# Patient Record
Sex: Female | Born: 1990 | Race: Black or African American | Hispanic: No | Marital: Single | Smoking: Never smoker
Health system: Southern US, Community
[De-identification: ages and names within clinical notes are randomized; demographics above are authoritative.]

---
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
## 2011-06-13 LAB — POCT I-STAT, CHEM 8
Calcium, Ion: 1.17
Glucose, Bld: 93
HCT: 43
Hemoglobin: 14.6

## 2011-06-13 LAB — PREGNANCY, URINE: Preg Test, Ur: NEGATIVE

## 2011-06-13 LAB — CBC
Platelets: 237
RDW: 13.9

## 2011-06-13 LAB — DIFFERENTIAL
Basophils Absolute: 0
Basophils Relative: 0
Eosinophils Relative: 1
Lymphocytes Relative: 40
Neutro Abs: 2.8

## 2011-06-13 LAB — URINE MICROSCOPIC-ADD ON

## 2011-06-13 LAB — URINALYSIS, ROUTINE W REFLEX MICROSCOPIC
Nitrite: POSITIVE — AB
Specific Gravity, Urine: 1.019
Urobilinogen, UA: 0.2

## 2011-06-13 LAB — URINE CULTURE: Colony Count: >=100000

## 2014-05-19 ENCOUNTER — Emergency Department (HOSPITAL_COMMUNITY)
Admission: EM | Admit: 2014-05-19 | Discharge: 2014-05-19 | Disposition: A | Discharge status: Home / Self Care | Payer: Self-pay | Attending: Emergency Medicine | Admitting: Emergency Medicine

## 2014-05-19 ENCOUNTER — Encounter (HOSPITAL_COMMUNITY): Payer: Self-pay | Admitting: Emergency Medicine

## 2014-05-19 DIAGNOSIS — K044 Acute apical periodontitis of pulpal origin: Secondary | ICD-10-CM | POA: Insufficient documentation

## 2014-05-19 DIAGNOSIS — K047 Periapical abscess without sinus: Secondary | ICD-10-CM

## 2014-05-19 DIAGNOSIS — K089 Disorder of teeth and supporting structures, unspecified: Secondary | ICD-10-CM | POA: Insufficient documentation

## 2014-05-19 DIAGNOSIS — K0381 Cracked tooth: Secondary | ICD-10-CM | POA: Insufficient documentation

## 2014-05-19 DIAGNOSIS — K0889 Other specified disorders of teeth and supporting structures: Secondary | ICD-10-CM

## 2014-05-19 MED ORDER — BUPIVACAINE HCL (PF) 0.5 % IJ SOLN
1.8000 mL | Freq: Once | INTRAMUSCULAR | Status: AC
  Administered 2014-05-19: 1.8 mL
  Filled 2014-05-19: qty 10

## 2014-05-19 MED ORDER — AMOXICILLIN 500 MG PO CAPS: 500.0000 mg | ORAL_CAPSULE | Freq: Three times a day (TID) | ORAL | Status: DC

## 2014-05-19 MED ORDER — HYDROCODONE-ACETAMINOPHEN 5-325 MG PO TABS: 1.0000 | ORAL_TABLET | ORAL | Status: DC | PRN

## 2014-05-19 NOTE — ED Provider Notes (Signed)
CSN: [id number]     Arrival date & time 05/19/14  1701 History  This chart was scribed for non-physician practitioner, Johnnette Gourd, PA-C, working with Elwin Mocha, MD by Milly Jakob, ED Scribe. The patient was seen in room TR07C/TR07C. Patient's care was started at 5:37 PM.     Chief Complaint  Patient presents with  . Dental Pain   The history is provided by the patient. No language interpreter was used.   HPI Comments: Carolyn Harmon is a 23 y.o. female who presents to the Emergency Department complaining of constant, left, lower tooth pain onset two months ago. She rates the pain as a 10/10, and reports that it gradually spread to the top of and back of her mouth, and her left ear. She reports using Tylenol an Orajel with minimal relief. She states that the pain is exacerbated by chewing, and is worse at nighttime. She reports that she has not seen a dentist for this because she does not have health insurance.   History reviewed. No pertinent past medical history. History reviewed. No pertinent past surgical history. No family history on file. History  Substance Use Topics  . Smoking status: Never Smoker   . Smokeless tobacco: Not on file  . Alcohol Use: Yes   OB History   Grav Para Term Preterm Abortions TAB SAB Ect Mult Living                 Review of Systems A complete 10 system review of systems was obtained and all systems are negative except as noted in the HPI and PMH.   Allergies  Review of patient's allergies indicates not on file.  Home Medications   Prior to Admission medications   Medication Sig Start Date End Date Taking? Authorizing Provider  amoxicillin (AMOXIL) 500 MG capsule Take 1 capsule (500 mg total) by mouth 3 (three) times daily. 05/19/14   Trevor Mace, PA-C  HYDROcodone-acetaminophen (NORCO/VICODIN) 5-325 MG per tablet Take 1-2 tablets by mouth every 4 (four) hours as needed. 05/19/14   Trevor Mace, PA-C   Triage Vitals: LMP  05/04/2014 Physical Exam  Nursing note and vitals reviewed. Constitutional: She is oriented to person, place, and time. She appears well-developed and well-nourished. No distress.  HENT:  Head: Normocephalic and atraumatic.  Mouth/Throat: Oropharynx is clear and moist.  Cracked tooth on her left lower 2nd and molar with surrounding erythema. No abscess.  Eyes: Conjunctivae and EOM are normal.  Neck: Normal range of motion. Neck supple.  Cardiovascular: Normal rate, regular rhythm and normal heart sounds.   Pulmonary/Chest: Effort normal and breath sounds normal. No respiratory distress.  Musculoskeletal: Normal range of motion. She exhibits no edema.  Neurological: She is alert and oriented to person, place, and time. No sensory deficit.  Skin: Skin is warm and dry.  Psychiatric: She has a normal mood and affect. Her behavior is normal.    ED Course  NERVE BLOCK Date/Time: 05/19/2014 5:56 PM Performed by: Trevor Mace Authorized by: Trevor Mace Consent: Verbal consent obtained. Consent given by: patient Patient understanding: patient states understanding of the procedure being performed Patient identity confirmed: verbally with patient and arm band Indications: pain relief Body area: face/mouth Nerve: inferior alveolar Laterality: left Patient sedated: no Preparation: Patient was prepped and draped in the usual sterile fashion. Patient position: sitting Needle gauge: 27 G Location technique: anatomical landmarks Local anesthetic: bupivacaine 0.5% without epinephrine Anesthetic total: 1.8 ml Outcome: pain improved Patient tolerance: Patient  tolerated the procedure well with no immediate complications.   (including critical care time)  5:41 PM-Discussed treatment plan which includes ABX, pain medication, and f/u w/ a dentist with pt at bedside and pt agreed to plan.   Labs Review Labs Reviewed - No data to display  Imaging Review No results found.   EKG  Interpretation None     MDM   Final diagnoses:  Pain, dental  Dental infection   Patient presents with dental pain. She is nontoxic appearing and in no apparent distress. VSS, AF. No tachycardia on my exam. Dental block performed with successful relief of pain. Will discharge from antibiotics and pain medication. Resources given for dental followup. Stable for discharge. Return precautions given. Patient states understanding of treatment care plan and is agreeable.  I personally performed the services described in this documentation, which was scribed in my presence. The recorded information has been reviewed and is accurate.   Trevor Mace, PA-C 05/19/14 1758

## 2014-05-19 NOTE — ED Notes (Signed)
Pt. Stated, I'VE HAD THIS PAIN FOR 2 MONTHS AND HAVE GONE THROUGH ABOU 2 TUBES.  oNE ON THE BOTTOM IS CRACKED AND ITS GOING TO MY EAR AND NECK.

## 2014-05-19 NOTE — ED Notes (Signed)
Declined W/C at D/C and was escorted to lobby by RN. 

## 2014-05-19 NOTE — Discharge Instructions (Signed)
Take Vicodin for severe pain only. No driving or operating heavy machinery while taking vicodin. This medication may cause drowsiness. Take antibiotics to completion. Followup with the dentist.  Dental Pain A tooth ache may be caused by cavities (tooth decay). Cavities expose the nerve of the tooth to air and hot or cold temperatures. It may come from an infection or abscess (also called a boil or furuncle) around your tooth. It is also often caused by dental caries (tooth decay). This causes the pain you are having. DIAGNOSIS  Your caregiver can diagnose this problem by exam. TREATMENT   If caused by an infection, it may be treated with medications which kill germs (antibiotics) and pain medications as prescribed by your caregiver. Take medications as directed.  Only take over-the-counter or prescription medicines for pain, discomfort, or fever as directed by your caregiver.  Whether the tooth ache today is caused by infection or dental disease, you should see your dentist as soon as possible for further care. SEEK MEDICAL CARE IF: The exam and treatment you received today has been provided on an emergency basis only. This is not a substitute for complete medical or dental care. If your problem worsens or new problems (symptoms) appear, and you are unable to meet with your dentist, call or return to this location. SEEK IMMEDIATE MEDICAL CARE IF:   You have a fever.  You develop redness and swelling of your face, jaw, or neck.  You are unable to open your mouth.  You have severe pain uncontrolled by pain medicine. MAKE SURE YOU:   Understand these instructions.  Will watch your condition.  Will get help right away if you are not doing well or get worse. Document Released: 08/27/2005 Document Revised: 11/19/2011 Document Reviewed: 04/14/2008 [facility] Patient Information 2015 [place], [place]. This information is not intended to replace advice given to you by your health care provider.  Make sure you discuss any questions you have with your health care provider.

## 2014-05-19 NOTE — ED Provider Notes (Signed)
Medical screening examination/treatment/procedure(s) were performed by non-physician practitioner and as supervising physician I was immediately available for consultation/collaboration.   EKG Interpretation None        [place], MD 05/19/14 1909 

## 2014-07-09 ENCOUNTER — Encounter (HOSPITAL_COMMUNITY): Payer: Self-pay | Admitting: Emergency Medicine

## 2014-07-09 ENCOUNTER — Emergency Department (HOSPITAL_COMMUNITY)
Admission: EM | Admit: 2014-07-09 | Discharge: 2014-07-10 | Disposition: A | Discharge status: Home / Self Care | Payer: Self-pay | Attending: Emergency Medicine | Admitting: Emergency Medicine

## 2014-07-09 DIAGNOSIS — R000 Tachycardia, unspecified: Secondary | ICD-10-CM | POA: Insufficient documentation

## 2014-07-09 DIAGNOSIS — Z3202 Encounter for pregnancy test, result negative: Secondary | ICD-10-CM | POA: Insufficient documentation

## 2014-07-09 DIAGNOSIS — R6889 Other general symptoms and signs: Secondary | ICD-10-CM

## 2014-07-09 DIAGNOSIS — H53149 Visual discomfort, unspecified: Secondary | ICD-10-CM | POA: Insufficient documentation

## 2014-07-09 DIAGNOSIS — B349 Viral infection, unspecified: Secondary | ICD-10-CM

## 2014-07-09 LAB — CBC WITH DIFFERENTIAL/PLATELET
BASOS ABS: 0 10*3/uL (ref 0.0–0.1)
BASOS PCT: 0 % (ref 0–1)
Eosinophils Absolute: 0 10*3/uL (ref 0.0–0.7)
Eosinophils Relative: 0 % (ref 0–5)
HCT: 41.3 % (ref 36.0–46.0)
HEMOGLOBIN: 14.1 g/dL (ref 12.0–15.0)
LYMPHS PCT: 13 % (ref 12–46)
Lymphs Abs: 1.5 10*3/uL (ref 0.7–4.0)
MCH: 31 pg (ref 26.0–34.0)
MCHC: 34.1 g/dL (ref 30.0–36.0)
MCV: 90.8 fL (ref 78.0–100.0)
MONOS PCT: 7 % (ref 3–12)
Monocytes Absolute: 0.8 10*3/uL (ref 0.1–1.0)
NEUTROS ABS: 9.1 10*3/uL — AB (ref 1.7–7.7)
NEUTROS PCT: 80 % — AB (ref 43–77)
Platelets: 299 10*3/uL (ref 150–400)
RBC: 4.55 MIL/uL (ref 3.87–5.11)
RDW: 13.5 % (ref 11.5–15.5)
WBC: 11.3 10*3/uL — AB (ref 4.0–10.5)

## 2014-07-09 LAB — COMPREHENSIVE METABOLIC PANEL
ALBUMIN: 3.6 g/dL (ref 3.5–5.2)
ALK PHOS: 37 U/L — AB (ref 39–117)
ALT: 12 U/L (ref 0–35)
AST: 27 U/L (ref 0–37)
Anion gap: 14 (ref 5–15)
BILIRUBIN TOTAL: 0.3 mg/dL (ref 0.3–1.2)
BUN: 12 mg/dL (ref 6–23)
CHLORIDE: 99 meq/L (ref 96–112)
CO2: 23 mEq/L (ref 19–32)
Calcium: 9 mg/dL (ref 8.4–10.5)
Creatinine, Ser: 0.69 mg/dL (ref 0.50–1.10)
GFR calc Af Amer: >90 mL/min (ref >90)
GFR calc non Af Amer: >90 mL/min (ref >90)
Glucose, Bld: 92 mg/dL (ref 70–99)
POTASSIUM: 4.4 meq/L (ref 3.7–5.3)
Sodium: 136 mEq/L — ABNORMAL LOW (ref 137–147)
Total Protein: 8 g/dL (ref 6.0–8.3)

## 2014-07-09 LAB — URINALYSIS, ROUTINE W REFLEX MICROSCOPIC
BILIRUBIN URINE: NEGATIVE
GLUCOSE, UA: NEGATIVE mg/dL
HGB URINE DIPSTICK: NEGATIVE
Ketones, ur: 40 mg/dL — AB
Leukocytes, UA: NEGATIVE
NITRITE: NEGATIVE
PH: 6.5 (ref 5.0–8.0)
Protein, ur: NEGATIVE mg/dL
SPECIFIC GRAVITY, URINE: 1.026 (ref 1.005–1.030)
Urobilinogen, UA: 1 mg/dL (ref 0.0–1.0)

## 2014-07-09 LAB — POC URINE PREG, ED: Preg Test, Ur: NEGATIVE

## 2014-07-09 MED ORDER — SODIUM CHLORIDE 0.9 % IV BOLUS (SEPSIS)
1000.0000 mL | Freq: Once | INTRAVENOUS | Status: AC
  Administered 2014-07-09: 1000 mL via INTRAVENOUS

## 2014-07-09 MED ORDER — ACETAMINOPHEN 325 MG PO TABS
650.0000 mg | ORAL_TABLET | Freq: Once | ORAL | Status: AC
  Administered 2014-07-09: 650 mg via ORAL
  Filled 2014-07-09: qty 2

## 2014-07-09 NOTE — ED Provider Notes (Signed)
CSN: [id number]     Arrival date & time 07/09/14  [id number] History   First MD Initiated Contact with Patient 07/09/14 2223     Chief Complaint  Patient presents with  . Influenza     (Consider location/radiation/quality/duration/timing/severity/associated sxs/prior Treatment) The history is provided by the patient. No language interpreter was used.  Carolyn MannerKanika Bixler is a 23 y/o F with no known significant PMHx presenting to the ED with nasal congestion, cough, sore throat that has now improved, generalized bodyaches, and fever. Patient reported that when she coughs she coughs up green sputum. Patient reported that she has been having fever, highest 101F orally - stated that she has not been using anything for the fever. Stated that she started to develop a headache - denied worst headache of life or thunderclap onset. Stated that the headache is localized to the right side of the head described as an intermittent throbbing sensation. Patient reported that she has been feeling nauseous without episodes of emesis. Stated that people at work have similar symptoms and that a lot of these individuals has been calling out sick. Denied difficulty swallowing, worse headache of life, thunderclap onset, neck pain, neck stiffness, chest pain, shortness breath, difficulty breathing, loss of sensation, dysuria, sudden loss of vision, blurred vision, vomiting, diarrhea, melena, hematochezia, numbness, tingling, abdominal pain, urinary symptoms, vaginal bleeding, vaginal discharge. Last menstrual period 4 days ago. Denied flu vaccine.  PCP none   History reviewed. No pertinent past medical history. History reviewed. No pertinent past surgical history. History reviewed. No pertinent family history. History  Substance Use Topics  . Smoking status: Never Smoker   . Smokeless tobacco: Not on file  . Alcohol Use: Yes   OB History   Grav Para Term Preterm Abortions TAB SAB Ect Mult Living                 Review of  Systems  Constitutional: Positive for fever and chills.  Eyes: Positive for photophobia. Negative for pain, discharge, redness, itching and visual disturbance.  Respiratory: Positive for cough. Negative for chest tightness and shortness of breath.   Cardiovascular: Negative for chest pain.  Gastrointestinal: Positive for nausea. Negative for vomiting, abdominal pain, diarrhea, constipation, blood in stool and anal bleeding.  Genitourinary: Negative for dysuria, vaginal bleeding, vaginal discharge, vaginal pain and pelvic pain.  Musculoskeletal: Positive for myalgias. Negative for back pain, neck pain and neck stiffness.  Neurological: Positive for headaches. Negative for weakness and numbness.      Allergies  Review of patient's allergies indicates no known allergies.  Home Medications   Prior to Admission medications   Not on File   BP 100/64  Pulse 93  Temp(Src) 100 F (37.8 C)  Resp 18  SpO2 100%  LMP 07/07/2014 Physical Exam  Nursing note and vitals reviewed. Constitutional: She is oriented to person, place, and time. She appears well-developed and well-nourished. No distress.  HENT:  Head: Normocephalic and atraumatic.  Mouth/Throat: Oropharynx is clear and moist. No oropharyngeal exudate.  Uvula midline with symmetrical elevation. Negative tonsillar adenopathy identified. Negative trismus.  Eyes: Conjunctivae and EOM are normal. Pupils are equal, round, and reactive to light. Right eye exhibits no discharge. Left eye exhibits no discharge.  Negative nystagmus Visual fields grossly intact EOMs intact  Neck: Normal range of motion. Neck supple. No tracheal deviation present.  Negative neck stiffness Negative nuchal rigidity Negative cervical lymphadenopathy Negative meningeal signs Negative pain upon palpation to the c-spine   Cardiovascular: Regular rhythm and  normal heart sounds.   Tachycardia upon auscultation Cap refill less than 3 seconds Negative swelling or  pitting edema identified to lower extremities bilaterally  Pulmonary/Chest: Effort normal and breath sounds normal. No respiratory distress. She has no wheezes. She has no rales. She exhibits no tenderness.  Patient is able to speak in full sentences without difficulty Negative use of accessory muscles Negative stridor  Rhonchi auscultated on the left lower lobe  Musculoskeletal: Normal range of motion.  Full ROM to upper and lower extremities without difficulty noted, negative ataxia noted.  Lymphadenopathy:    She has no cervical adenopathy.  Neurological: She is alert and oriented to person, place, and time. No cranial nerve deficit. She exhibits normal muscle tone. Coordination normal.  Cranial nerves III-XII grossly intact Strength 5+/5+ to upper and lower extremities bilaterally with resistance applied, equal distribution noted Equal grip strength bilaterally Negative facial droop Negative slurred speech Negative aphasia Negative arm drift Fine motor skills intact  Skin: Skin is warm and dry. No rash noted. She is not diaphoretic. No erythema.  Psychiatric: She has a normal mood and affect. Her behavior is normal. Thought content normal.    ED Course  Procedures (including critical care time)  Results for orders placed during the hospital encounter of 07/09/14  CBC WITH DIFFERENTIAL      Result Value Ref Range   WBC 11.3 (*) 4.0 - 10.5 K/uL   RBC 4.55  3.87 - 5.11 MIL/uL   Hemoglobin 14.1  12.0 - 15.0 g/dL   HCT 98.141.3  19.136.0 - 47.846.0 %   MCV 90.8  78.0 - 100.0 fL   MCH 31.0  26.0 - 34.0 pg   MCHC 34.1  30.0 - 36.0 g/dL   RDW 29.513.5  62.111.5 - 30.815.5 %   Platelets 299  150 - 400 K/uL   Neutrophils Relative % 80 (*) 43 - 77 %   Neutro Abs 9.1 (*) 1.7 - 7.7 K/uL   Lymphocytes Relative 13  12 - 46 %   Lymphs Abs 1.5  0.7 - 4.0 K/uL   Monocytes Relative 7  3 - 12 %   Monocytes Absolute 0.8  0.1 - 1.0 K/uL   Eosinophils Relative 0  0 - 5 %   Eosinophils Absolute 0.0  0.0 - 0.7 K/uL    Basophils Relative 0  0 - 1 %   Basophils Absolute 0.0  0.0 - 0.1 K/uL  COMPREHENSIVE METABOLIC PANEL      Result Value Ref Range   Sodium 136 (*) 137 - 147 mEq/L   Potassium 4.4  3.7 - 5.3 mEq/L   Chloride 99  96 - 112 mEq/L   CO2 23  19 - 32 mEq/L   Glucose, Bld 92  70 - 99 mg/dL   BUN 12  6 - 23 mg/dL   Creatinine, Ser 6.570.69  0.50 - 1.10 mg/dL   Calcium 9.0  8.4 - 84.610.5 mg/dL   Total Protein 8.0  6.0 - 8.3 g/dL   Albumin 3.6  3.5 - 5.2 g/dL   AST 27  0 - 37 U/L   ALT 12  0 - 35 U/L   Alkaline Phosphatase 37 (*) 39 - 117 U/L   Total Bilirubin 0.3  0.3 - 1.2 mg/dL   GFR calc non Af Amer >90  >90 mL/min   GFR calc Af Amer >90  >90 mL/min   Anion gap 14  5 - 15  URINALYSIS, ROUTINE W REFLEX MICROSCOPIC  Result Value Ref Range   Color, Urine YELLOW  YELLOW   APPearance CLOUDY (*) CLEAR   Specific Gravity, Urine 1.026  1.005 - 1.030   pH 6.5  5.0 - 8.0   Glucose, UA NEGATIVE  NEGATIVE mg/dL   Hgb urine dipstick NEGATIVE  NEGATIVE   Bilirubin Urine NEGATIVE  NEGATIVE   Ketones, ur 40 (*) NEGATIVE mg/dL   Protein, ur NEGATIVE  NEGATIVE mg/dL   Urobilinogen, UA 1.0  0.0 - 1.0 mg/dL   Nitrite NEGATIVE  NEGATIVE   Leukocytes, UA NEGATIVE  NEGATIVE  POC URINE PREG, ED      Result Value Ref Range   Preg Test, Ur NEGATIVE  NEGATIVE    Labs Review Labs Reviewed  CBC WITH DIFFERENTIAL - Abnormal; Notable for the following:    WBC 11.3 (*)    Neutrophils Relative % 80 (*)    Neutro Abs 9.1 (*)    All other components within normal limits  COMPREHENSIVE METABOLIC PANEL - Abnormal; Notable for the following:    Sodium 136 (*)    Alkaline Phosphatase 37 (*)    All other components within normal limits  URINALYSIS, ROUTINE W REFLEX MICROSCOPIC - Abnormal; Notable for the following:    APPearance CLOUDY (*)    Ketones, ur 40 (*)    All other components within normal limits  POC URINE PREG, ED    Imaging Review Dg Chest 2 View  07/10/2014   CLINICAL DATA:  Fever, chills,  headache, and body aches.  EXAM: CHEST  2 VIEW  COMPARISON:  None.  FINDINGS: The heart size and mediastinal contours are within normal limits. Both lungs are clear. The visualized skeletal structures are unremarkable.  IMPRESSION: Normal exam.   Electronically Signed   By: Geanie Cooley M.D.   On: 07/10/2014 00:48     EKG Interpretation None     1:18 PM This provider reassess the patient. Patient denied headache. Stated that she is feeling a lot better. Denied nausea.  MDM   Final diagnoses:  Flu-like symptoms  Viral syndrome    Medications  sodium chloride 0.9 % bolus 1,000 mL (0 mLs Intravenous Stopped 07/10/14 0051)  acetaminophen (TYLENOL) tablet 650 mg (650 mg Oral Given 07/09/14 2311)   Filed Vitals:   07/10/14 0000 07/10/14 0045 07/10/14 0100 07/10/14 0115  BP: 112/78 103/71 112/51 100/64  Pulse: 96 89 102 93  Temp:      Resp:      SpO2: 100% 100% 100% 100%   CBC noted mildly elevated white blood cell count 11.3. Hemoglobin and hematocrit within normal limits. CMP unremarkable. Negative anion gap elevation. Urine pregnancy negative. Urinalysis unremarkable-negative findings of urinary tract infection. Chest x-ray negative findings. Doubt meningitis. Suspicion to be viral syndrome. Patient tolerated fluids by mouth without difficulty-negative episodes of emesis while in the setting. Fever and heart rate controlled while in ED setting with IV fluids and Tylenol. Patient stable, afebrile. Patient not septic appearing. Discharged patients. Discussed with patient to rest and stay hydrated. Referred to primary care provider. Discussed with patient to closely monitor symptoms and if symptoms are to worsen or change to report back to the ED - strict return instructions given.  Patient agreed to plan of care, understood, all questions answered.   Raymon Mutton, PA-C 07/10/14 [phone]

## 2014-07-09 NOTE — ED Notes (Signed)
Notified x-ray that pt is ready.

## 2014-07-09 NOTE — ED Notes (Signed)
Pt reports not feeling well x 2 days with fever, chills, headache and bodyaches. Denies n/v/d. Mask on pt at triage.

## 2014-07-10 ENCOUNTER — Emergency Department (HOSPITAL_COMMUNITY): Payer: Self-pay

## 2014-07-10 NOTE — ED Provider Notes (Signed)
Medical screening examination/treatment/procedure(s) were performed by non-physician practitioner and as supervising physician I was immediately available for consultation/collaboration.   EKG Interpretation None       Doug SouSam Early Steel, MD 07/10/14 1414

## 2014-07-10 NOTE — Discharge Instructions (Signed)
Please call your doctor for a followup appointment within 24-48 hours. When you talk to your doctor please let them know that you were seen in the emergency department and have them acquire all of your records so that they can discuss the findings with you and formulate a treatment plan to fully care for your new and ongoing problems. Please rest and stay hydrated Please drink plenty of water Please rest and avoid any physical or strenuous activities Please continue to monitor symptoms closely and if symptoms are to worsen or change (fever greater than 101, chills, sweating, nausea, vomiting, chest pain, shortness of breathe, difficulty breathing, weakness, numbness, tingling, worsening or changes to pain pattern, neck swelling, neck pain, neck stiffness, inability to keep any food or fluids down, blurred vision, worst headache of life) please report back to the Emergency Department immediately.   Fever, Adult _A fever is a temperature of 100.4 F (38 C) or above.  HOME CARE  Take fever medicine as told by your doctor. Do not  take aspirin for fever if you are younger than 22 years of age.  If you are given antibiotic medicine, take it as told. Finish the medicine even if you start to feel better.  Rest.  Drink enough fluids to keep your pee (urine) clear or pale yellow. Do not drink alcohol.  Take a bath or shower with room temperature water. Do not use ice water or alcohol sponge baths.  Wear lightweight, loose clothes. GET HELP RIGHT AWAY IF:   You are short of breath or have trouble breathing.  You are very weak.  You are dizzy or you pass out (faint).  You are very thirsty or are making little or no urine.  You have new pain.  You throw up (vomit) or have watery poop (diarrhea).  You keep throwing up or having watery poop for more than 1 to 2 days.  You have a stiff neck or light bothers your eyes.  You have a skin rash.  You have a fever or problems (symptoms) that  last for more than 2 to 3 days.  You have a fever and your problems quickly get worse.  You keep throwing up the fluids you drink.  You do not feel better after 3 days.  You have new problems. MAKE SURE YOU:   Understand these instructions.  Will watch your condition.  Will get help right away if you are not doing well or get worse. Document Released: 06/05/2008 Document Revised: 11/19/2011 Document Reviewed: 06/28/2011 [facility] Patient Information 2015 [place], [place]. This information is not intended to replace advice given to you by your health care provider. Make sure you discuss any questions you have with your health care provider. Viral Infections A virus is a type of germ. Viruses can cause:  Minor sore throats.  Aches and pains.  Headaches.  Runny nose.  Rashes.  Watery eyes.  Tiredness.  Coughs.  Loss of appetite.  Feeling sick to your stomach (nausea).  Throwing up (vomiting).  Watery poop (diarrhea). HOME CARE   Only take medicines as told by your doctor.  Drink enough water and fluids to keep your pee (urine) clear or pale yellow. Sports drinks are a good choice.  Get plenty of rest and eat healthy. Soups and broths with crackers or rice are fine. GET HELP RIGHT AWAY IF:   You have a very bad headache.  You have shortness of breath.  You have chest pain or neck pain.  You have  an unusual rash.  You cannot stop throwing up.  You have watery poop that does not stop.  You cannot keep fluids down.  You or your child has a temperature by mouth above 102 F (38.9 C), not controlled by medicine.  Your baby is older than 3 months with a rectal temperature of 102 F (38.9 C) or higher.  Your baby is 643 months old or younger with a rectal temperature of 100.4 F (38 C) or higher. MAKE SURE YOU:   Understand these instructions.  Will watch this condition.  Will get help right away if you are not doing well or get worse. Document  Released: 08/09/2008 Document Revised: 11/19/2011 Document Reviewed: 01/02/2011 [facility] Patient Information 2015 [place], [place]. This information is not intended to replace advice given to you by your health care provider. Make sure you discuss any questions you have with your health care provider.

## 2014-08-25 ENCOUNTER — Emergency Department (HOSPITAL_COMMUNITY)
Admission: EM | Admit: 2014-08-25 | Discharge: 2014-08-25 | Disposition: A | Discharge status: Home / Self Care | Payer: Self-pay | Attending: Emergency Medicine | Admitting: Emergency Medicine

## 2014-08-25 ENCOUNTER — Encounter (HOSPITAL_COMMUNITY): Payer: Self-pay | Admitting: Emergency Medicine

## 2014-08-25 DIAGNOSIS — K047 Periapical abscess without sinus: Secondary | ICD-10-CM | POA: Insufficient documentation

## 2014-08-25 DIAGNOSIS — K029 Dental caries, unspecified: Secondary | ICD-10-CM | POA: Insufficient documentation

## 2014-08-25 DIAGNOSIS — K0889 Other specified disorders of teeth and supporting structures: Secondary | ICD-10-CM

## 2014-08-25 DIAGNOSIS — K088 Other specified disorders of teeth and supporting structures: Secondary | ICD-10-CM | POA: Insufficient documentation

## 2014-08-25 MED ORDER — HYDROCODONE-ACETAMINOPHEN 5-325 MG PO TABS: 1.0000 | ORAL_TABLET | Freq: Four times a day (QID) | ORAL | Status: DC | PRN

## 2014-08-25 MED ORDER — IBUPROFEN 200 MG PO TABS
400.0000 mg | ORAL_TABLET | Freq: Once | ORAL | Status: AC
  Administered 2014-08-25: 400 mg via ORAL
  Filled 2014-08-25: qty 2

## 2014-08-25 MED ORDER — HYDROCODONE-ACETAMINOPHEN 5-325 MG PO TABS
2.0000 | ORAL_TABLET | Freq: Once | ORAL | Status: AC
  Administered 2014-08-25: 2 via ORAL
  Filled 2014-08-25: qty 2

## 2014-08-25 MED ORDER — AMOXICILLIN 500 MG PO CAPS: 500.0000 mg | ORAL_CAPSULE | Freq: Three times a day (TID) | ORAL | Status: DC

## 2014-08-25 NOTE — Discharge Instructions (Signed)
It was our pleasure to provide your ER care today - we hope that you feel better.  Take antibiotic (amoxicillin) as prescribed. Take motrin or aleve as need for pain. You may also take vicodin as need for pain. No driving for the next 6 hours or when taking vicodin. Also, do not take tylenol or acetaminophen containing medication when taking vicodin.   Given recurrent/persistent problems with this tooth, follow up with dentist/oral surgeon in the next few days  - call office tomorrow morning to arrange follow up appointment.    Return to ER if worse, facial/neck swelling, high fevers, intractable pain, trouble breathing or swallowing, other concern.       Dental Abscess A dental abscess is a collection of infected fluid (pus) from a bacterial infection in the inner part of the tooth (pulp). It usually occurs at the end of the tooth's root.  CAUSES   Severe tooth decay.  Trauma to the tooth that allows bacteria to enter into the pulp, such as a broken or chipped tooth. SYMPTOMS   Severe pain in and around the infected tooth.  Swelling and redness around the abscessed tooth or in the mouth or face.  Tenderness.  Pus drainage.  Bad breath.  Bitter taste in the mouth.  Difficulty swallowing.  Difficulty opening the mouth.  Nausea.  Vomiting.  Chills.  Swollen neck glands. DIAGNOSIS   A medical and dental history will be taken.  An examination will be performed by tapping on the abscessed tooth.  X-rays may be taken of the tooth to identify the abscess. TREATMENT The goal of treatment is to eliminate the infection. You may be prescribed antibiotic medicine to stop the infection from spreading. A root canal may be performed to save the tooth. If the tooth cannot be saved, it may be pulled (extracted) and the abscess may be drained.  HOME CARE INSTRUCTIONS  Only take over-the-counter or prescription medicines for pain, fever, or discomfort as directed by your  caregiver.  Rinse your mouth (gargle) often with salt water ( tsp salt in 8 oz [250 ml] of warm water) to relieve pain or swelling.  Do not drive after taking pain medicine (narcotics).  Do not apply heat to the outside of your face.  Return to your dentist for further treatment as directed. SEEK MEDICAL CARE IF:  Your pain is not helped by medicine.  Your pain is getting worse instead of better. SEEK IMMEDIATE MEDICAL CARE IF:  You have a fever or persistent symptoms for more than 2-3 days.  You have a fever and your symptoms suddenly get worse.  You have chills or a very bad headache.  You have problems breathing or swallowing.  You have trouble opening your mouth.  You have swelling in the neck or around the eye. Document Released: 08/27/2005 Document Revised: 05/21/2012 Document Reviewed: 12/05/2010 [facility] Patient Information 2015 [place], [place]. This information is not intended to replace advice given to you by your health care provider. Make sure you discuss any questions you have with your health care provider.    Dental Caries Dental caries (also called tooth decay) is the most common oral disease. It can occur at any age but is more common in children and young adults.  HOW DENTAL CARIES DEVELOPS  The process of decay begins when bacteria and foods (particularly sugars and starches) combine in your mouth to produce plaque. Plaque is a substance that sticks to the hard, outer surface of a tooth (enamel). The bacteria  in plaque produce acids that attack enamel. These acids may also attack the root surface of a tooth (cementum) if it is exposed. Repeated attacks dissolve these surfaces and create holes in the tooth (cavities). If left untreated, the acids destroy the other layers of the tooth.  RISK FACTORS  Frequent sipping of sugary beverages.   Frequent snacking on sugary and starchy foods, especially those that easily get stuck in the teeth.   Poor oral  hygiene.   Dry mouth.   Substance abuse such as methamphetamine abuse.   Broken or poor-fitting dental restorations.   Eating disorders.   Gastroesophageal reflux disease (GERD).   Certain radiation treatments to the head and neck. SYMPTOMS In the early stages of dental caries, symptoms are seldom present. Sometimes white, chalky areas may be seen on the enamel or other tooth layers. In later stages, symptoms may include:  Pits and holes on the enamel.  Toothache after sweet, hot, or cold foods or drinks are consumed.  Pain around the tooth.  Swelling around the tooth. DIAGNOSIS  Most of the time, dental caries is detected during a regular dental checkup. A diagnosis is made after a thorough medical and dental history is taken and the surfaces of your teeth are checked for signs of dental caries. Sometimes special instruments, such as lasers, are used to check for dental caries. Dental X-ray exams may be taken so that areas not visible to the eye (such as between the contact areas of the teeth) can be checked for cavities.  TREATMENT  If dental caries is in its early stages, it may be reversed with a fluoride treatment or an application of a remineralizing agent at the dental office. Thorough brushing and flossing at home is needed to aid these treatments. If it is in its later stages, treatment depends on the location and extent of tooth destruction:   If a small area of the tooth has been destroyed, the destroyed area will be removed and cavities will be filled with a material such as gold, silver amalgam, or composite resin.   If a large area of the tooth has been destroyed, the destroyed area will be removed and a cap (crown) will be fitted over the remaining tooth structure.   If the center part of the tooth (pulp) is affected, a procedure called a root canal will be needed before a filling or crown can be placed.   If most of the tooth has been destroyed, the tooth  may need to be pulled (extracted). HOME CARE INSTRUCTIONS You can prevent, stop, or reverse dental caries at home by practicing good oral hygiene. Good oral hygiene includes:  Thoroughly cleaning your teeth at least twice a day with a toothbrush and dental floss.   Using a fluoride toothpaste. A fluoride mouth rinse may also be used if recommended by your dentist or health care provider.   Restricting the amount of sugary and starchy foods and sugary liquids you consume.   Avoiding frequent snacking on these foods and sipping of these liquids.   Keeping regular visits with a dentist for checkups and cleanings. PREVENTION   Practice good oral hygiene.  Consider a dental sealant. A dental sealant is a coating material that is applied by your dentist to the pits and grooves of teeth. The sealant prevents food from being trapped in them. It may protect the teeth for several years.  Ask about fluoride supplements if you live in a community without fluorinated water or with water  that has a low fluoride content. Use fluoride supplements as directed by your dentist or health care provider.  Allow fluoride varnish applications to teeth if directed by your dentist or health care provider. Document Released: 05/19/2002 Document Revised: 01/11/2014 Document Reviewed: 08/29/2012 [facility] Patient Information 2015 [place], [place]. This information is not intended to replace advice given to you by your health care provider. Make sure you discuss any questions you have with your health care provider.   Dental Care and Dentist Visits Dental care supports good overall health. Regular dental visits can also help you avoid dental pain, bleeding, infection, and other more serious health problems in the future. It is important to keep the mouth healthy because diseases in the teeth, gums, and other oral tissues can spread to other areas of the body. Some problems, such as diabetes, heart disease, and  pre-term labor have been associated with poor oral health.  See your dentist every 6 months. If you experience emergency problems such as a toothache or broken tooth, go to the dentist right away. If you see your dentist regularly, you may catch problems early. It is easier to be treated for problems in the early stages.  WHAT TO EXPECT AT A DENTIST VISIT  Your dentist will look for many common oral health problems and recommend proper treatment. At your regular dental visit, you can expect:  Gentle cleaning of the teeth and gums. This includes scraping and polishing. This helps to remove the sticky substance around the teeth and gums (plaque). Plaque forms in the mouth shortly after eating. Over time, plaque hardens on the teeth as tartar. If tartar is not removed regularly, it can cause problems. Cleaning also helps remove stains.  Periodic X-rays. These pictures of the teeth and supporting bone will help your dentist assess the health of your teeth.  Periodic fluoride treatments. Fluoride is a natural mineral shown to help strengthen teeth. Fluoride treatmentinvolves applying a fluoride gel or varnish to the teeth. It is most commonly done in children.  Examination of the mouth, tongue, jaws, teeth, and gums to look for any oral health problems, such as:  Cavities (dental caries). This is decay on the tooth caused by plaque, sugar, and acid in the mouth. It is best to catch a cavity when it is small.  Inflammation of the gums caused by plaque buildup (gingivitis).  Problems with the mouth or malformed or misaligned teeth.  Oral cancer or other diseases of the soft tissues or jaws. KEEP YOUR TEETH AND GUMS HEALTHY For healthy teeth and gums, follow these general guidelines as well as your dentist's specific advice:  Have your teeth professionally cleaned at the dentist every 6 months.  Brush twice daily with a fluoride toothpaste.  Floss your teeth daily.  Ask your dentist if you  need fluoride supplements, treatments, or fluoride toothpaste.  Eat a healthy diet. Reduce foods and drinks with added sugar.  Avoid smoking. TREATMENT FOR ORAL HEALTH PROBLEMS If you have oral health problems, treatment varies depending on the conditions present in your teeth and gums.  Your caregiver will most likely recommend good oral hygiene at each visit.  For cavities, gingivitis, or other oral health disease, your caregiver will perform a procedure to treat the problem. This is typically done at a separate appointment. Sometimes your caregiver will refer you to another dental specialist for specific tooth problems or for surgery. SEEK IMMEDIATE DENTAL CARE IF:  You have pain, bleeding, or soreness in the gum, tooth, jaw,  or mouth area.  A permanent tooth becomes loose or separated from the gum socket.  You experience a blow or injury to the mouth or jaw area. Document Released: 05/09/2011 Document Revised: 11/19/2011 Document Reviewed: 05/09/2011 [facility] Patient Information 2015 [place], [place]. This information is not intended to replace advice given to you by your health care provider. Make sure you discuss any questions you have with your health care provider.      Emergency Department Resource Guide 1) Find a Doctor and Pay Out of Pocket Although you won't have to find out who is covered by your insurance plan, it is a good idea to ask around and get recommendations. You will then need to call the office and see if the doctor you have chosen will accept you as a new patient and what types of options they offer for patients who are self-pay. Some doctors offer discounts or will set up payment plans for their patients who do not have insurance, but you will need to ask so you aren't surprised when you get to your appointment.  2) Contact Your Local Health Department Not all health departments have doctors that can see patients for sick visits, but many do, so it is worth a  call to see if yours does. If you don't know where your local health department is, you can check in your phone book. The CDC also has a tool to help you locate your state's health department, and many state websites also have listings of all of their local health departments.  3) Find a Walk-in Clinic If your illness is not likely to be very severe or complicated, you may want to try a walk in clinic. These are popping up all over the country in pharmacies, drugstores, and shopping centers. They're usually staffed by nurse practitioners or physician assistants that have been trained to treat common illnesses and complaints. They're usually fairly quick and inexpensive. However, if you have serious medical issues or chronic medical problems, these are probably not your best option.  No Primary Care Doctor: - Call Health Connect at  [phone] - they can help you locate a primary care doctor that  accepts your insurance, provides certain services, etc. - Physician Referral Service- [phone]  Chronic Pain Problems: Organization         Address  Phone   Notes  [facility]  [phone] Patients need to be referred by their primary care doctor.   Medication Assistance: Organization         Address  Phone   Notes  [facility] Medication [facility] [address] [place] [place], [place] [address] [phone] --Must be a resident of [facility] [place] -- Must have NO insurance coverage whatsoever (no Medicaid/ Medicare, etc.) -- The pt. MUST have a primary care doctor that directs their care regularly and follows them in the community   [facility]  [phone]   [employer]  [phone]    Agencies that provide inexpensive medical care: Organization         Address  Phone   Notes  [facility]  [phone]   [facility]    [phone]   [facility] [address] [place], [place] [address]  [phone]   [facility] [address], [place] [phone]   Planned Parenthood    [phone]   [facility]    [phone]  [facility]  [address], [place] Phone:  [phone], Fax:  [phone] Hours of Operation:  9 am - 6 pm, M-F.  Also accepts Medicaid/Medicare and self-pay.  [facility]  [address], [place] Phone: [phone], Fax: [phone]. Hours of Operation:  8:30 am - 5:30 pm, M-F.  Also accepts Medicaid and self-pay.  [facility] [place] [address] [place] Phone: [phone]   [facility] [address] Natasha Bence [place], [place] [phone], Ext. [phone] Mondays & Thursdays: 7-9 AM.  First 15 patients are seen on a first come, first serve basis.    Medicaid-accepting [facility] Providers:  Organization         Address  Phone   Notes  [facility] [address], [place] [phone] Also accepts self-pay patients.  [facility] [address] Laurell Josephs [place], [place]  [phone]   [facility] [address], [place] [phone]   [facility] [address] [place] [phone]   Renaye Rakers [address], [place]   [phone] Only accepts [place] patients after they have their name applied to their card.   Self-Pay (no insurance) in [facility]:  Organization         Address  Phone   Notes  Sickle Cell Patients, [facility] [address] [place], [place] [phone]   [facility] [address] [place], [place] [phone]   [facility] [place]  [address], [place] [phone]   [facility]/Dr. Osei-Bonsu  [address], [place] or [address],  [place] [phone] Phone number for both [place] locations is the same.  [facility] and [facility] [address], [place] [phone]   [facility] [address] [place] or [address] [phone]   [facility] [address] [place] [phone], phone; [phone], fax Sees patients 1st and 3rd Saturday of every month.  Must not qualify for public or private insurance (i.e. Medicaid, Medicare, [place] Health Choice, Veterans' Benefits)  Household income should be no more than 200% of the poverty level The clinic cannot treat you if you are pregnant or think you are pregnant  Sexually transmitted diseases are not treated at the clinic.    Dental Care: Organization         Address  Phone  Notes  [facility] Department of [facility] [address] [place], [place] [phone] Accepts children up to age 81 who are enrolled in [place] or [place] Health Choice; pregnant women with a Medicaid card; and children who have applied for Medicaid or [place] Health Choice, but were declined, whose parents can pay a reduced fee at time of service.  [facility]  [address], [place] [phone] Accepts children up to age 93 who are enrolled in [place] or [place] Health Choice; pregnant women with a Medicaid card; and children who have applied for Medicaid or [place] Health Choice, but were declined, whose parents can pay a reduced fee at time of service.  [facility]  [phone]  [address], [place] [phone] Patients are seen by appointment only. Walk-ins are not accepted. [facility] will see patients 22 years of age and older. Monday - Tuesday (8am-5pm) Most Wednesdays (8:30-5pm) $30 per visit, cash only  [facility]  [address], [facility] [place] [phone] Patients  are seen by appointment only. Walk-ins are not accepted. [facility] will see patients 22 years of age and older. One Wednesday Evening (Monthly: Volunteer Based).  $30 per visit, cash only  [employer] [facility] [facility]  [phone] for adults; Children under age 744, call Graduate Pediatric Dentistry at [phone]. Children aged 554-14, please call [phone] to request a pediatric application.  Dental services are provided in all areas of dental care including fillings, crowns and bridges, complete and partial dentures, implants, gum treatment, root canals, and extractions. Preventive care is also provided. Treatment is provided to both adults and children. Patients are selected via a lottery and there is often a waiting list.   [facility] [address], [place]  [phone] [email or web address]   [facility] [address], [place], [place] [phone], Ext. [phone] Second and Fourth Thursday of each month, opens at 6:30 AM; Clinic ends at 9 AM.  Patients are seen on a first-come first-served basis, and a limited number are seen during each clinic.   [facility]  [address], [place], [place] [phone]   Eligibility Requirements You must have lived in [place], [place], or [place] counties for at least the last three months.   You cannot be eligible for state or federal sponsored [place]healthcare insurance, including CIG[employer]eterans Administration, [place]icaid, or [employer]icare.   You generally cannot be eligible for healthcare insurance through your employer.    How to apply: Eligibility screenings are held every Tuesday and Wednesday afternoon from 1:00 pm until 4:00 pm. You do not need an appointment for the interview!  [facility] [address], [place], [place] [phone]   [facility]  [phone]   [facility]  [phone]   [facility]  [phone]     Behavioral Health Resources in the Community: Intensive Outpatient Programs Organization         Address  Phone  Notes  [facility] [address], [place], [place] [phone]   [facility] [address], [place], [place] [phone]   [facility] [address], [place], [place]  [phone]   [facility] [address],  [place], [place] [phone] or [phone]   Substance Abuse Resources Organization         Address  Phone  Notes  [facility]  [phone]   [facility]  [phone]   [facility]  [phone]   [facility]  [phone]   Residential & Outpatient Substance Abuse Program  [phone]   Psychological Services Organization         Address  Phone  Notes  [facility]  [phone]- [phone]   [facility]  [phone]- [phone]   [facility] [address], [place] [phone] or [phone]    Mobile Crisis Teams Organization         Address  Phone  Notes  [facility], Mobile Crisis Care Unit  [phone]   [facility]  [address] [place], [place] [phone]   Jasmine DecemberSharon  DeEsch [address] [place] [phone]    Self-Help/Support Groups Organization         Address  Phone             Notes  [facility] [place] - variety of support groups  [phone]- [phone] Call for more information  Narcotics Anonymous (NA), [facility] [address], [employer] [place]  2 meetings at this location   Statistician         Address  Phone  Notes  [facility] [address],    [place]  [phone]   [facility]  [address] [place] [address]213, [place], [place] [phone]   [facility] [address] [place], [place] [phone] Admissions: 8am-3pm M-F  Incentives  [facility] Treatment Center [address],    [place], [place] [phone]   [facility] [address] [place], [place], [place] [phone]   [facility] [address]  [place], [place] [phone]   [facility] - Intensive Outpatient [address]ure[address], [place], [place] [phone]   [facility] ([facility] [address] [place]  [place], [place] [phone] or [phone]   [facility] ([facility]) [address] [place], [place] [phone] Accepts Medicaid  [facility] [address],  [place] [phone] Substance Abuse/Addiction Treatment   [facility] Organization         Address  Phone  Notes  [facility]  [phone]   Angie Fava, PhD [address] Ervin Knack [place], [place]   [phone] or [phone]   [facility]   [address] [place], [place] [phone]   [facility] [address], [place], [place] [phone] Insurance/Medicaid/sponsorship through [facility] and Families [address], [address]                                    [place], [place] [phone] Therapy/tele-psych/case  [facility] [address], [place] [phone]    Dr. Lolly Mustache  [phone]   [facility]  [employer] [facility] Dept. 1) [address] [place] 2) [address], [place] 3)  [address], [place] [phone]  [phone]   [facility] Child Abuse Hotline [phone] or [phone] (After Hours)

## 2014-08-25 NOTE — ED Provider Notes (Signed)
CSN: [id number]     Arrival date & time 08/25/14  0223 History   First MD Initiated Contact with Patient 08/25/14 0224     Chief Complaint  Patient presents with  . Dental Pain     (Consider location/radiation/quality/duration/timing/severity/associated sxs/prior Treatment) Patient is a 23 y.o. female presenting with tooth pain. The history is provided by the patient.  Dental Pain Associated symptoms: no fever, no headaches and no neck pain   pt c/o left lower posterior dental pain intermittent for the past few months.  In past day, pain constant, dull, moderate/severe. No associated fevers. No facial swelling or erythema. No sore throat or swelling. No neck pain or swelling. Has no local dentist.      History reviewed. No pertinent past medical history. History reviewed. No pertinent past surgical history. No family history on file. History  Substance Use Topics  . Smoking status: Never Smoker   . Smokeless tobacco: Not on file  . Alcohol Use: Yes   OB History    No data available     Review of Systems  Constitutional: Negative for fever and chills.  HENT: Negative for sore throat.   Gastrointestinal: Negative for nausea and vomiting.  Musculoskeletal: Negative for neck pain and neck stiffness.  Neurological: Negative for headaches.      Allergies  Review of patient's allergies indicates no known allergies.  Home Medications   Prior to Admission medications   Not on File   BP 138/91 mmHg  Pulse 95  Temp(Src) 97.8 F (36.6 C) (Oral)  Resp 22  SpO2 100% Physical Exam  Constitutional: She appears well-developed and well-nourished. No distress.  HENT:  Mouth/Throat: Oropharynx is clear and moist.  Left posterior molar impacted, decayed w associated gum swelling and tenderness. No facial erythema or swelling. No swelling or tenderness to floor of mouth or throat. No pharyngeal swelling.     Eyes: Conjunctivae are normal. No scleral icterus.  Neck: Neck  supple. No tracheal deviation present.  Cardiovascular: Normal rate.   Pulmonary/Chest: Effort normal. No respiratory distress.  Abdominal: Normal appearance.  Musculoskeletal: She exhibits no edema.  Lymphadenopathy:    She has no cervical adenopathy.  Neurological: She is alert.  Skin: Skin is warm and dry. No rash noted. She is not diaphoretic.  Psychiatric: She has a normal mood and affect.  Nursing note and vitals reviewed.   ED Course  Procedures (including critical care time) Labs Review   MDM   Pt states family member drove her, does not have to drive.  No meds pta.   Motrin po. vicodin po.  Confirmed nkda w pt.  Will give rx amox, pain rx, and discussed close dental follow up.      Suzi RootsKevin E Emil Klassen, MD 08/25/14 [phone]

## 2014-08-25 NOTE — ED Notes (Signed)
Patient has been experiencing dental pain for months now, but tonights pain was worse than usual. Patient describes it as a jolting and throbbing pain that woke her up from sleep. Has been seen for same before, but unable to go to D[facility] d/t insurance reasons. Pain 10/10 a/o x4

## 2014-08-26 ENCOUNTER — Emergency Department (HOSPITAL_COMMUNITY)
Admission: EM | Admit: 2014-08-26 | Discharge: 2014-08-26 | Disposition: A | Discharge status: Home / Self Care | Payer: Self-pay | Attending: Emergency Medicine | Admitting: Emergency Medicine

## 2014-08-26 ENCOUNTER — Encounter (HOSPITAL_COMMUNITY): Payer: Self-pay | Admitting: Emergency Medicine

## 2014-08-26 DIAGNOSIS — K006 Disturbances in tooth eruption: Secondary | ICD-10-CM | POA: Insufficient documentation

## 2014-08-26 DIAGNOSIS — Z792 Long term (current) use of antibiotics: Secondary | ICD-10-CM | POA: Insufficient documentation

## 2014-08-26 DIAGNOSIS — K0889 Other specified disorders of teeth and supporting structures: Secondary | ICD-10-CM

## 2014-08-26 DIAGNOSIS — K088 Other specified disorders of teeth and supporting structures: Secondary | ICD-10-CM | POA: Insufficient documentation

## 2014-08-26 MED ORDER — OXYCODONE-ACETAMINOPHEN 5-325 MG PO TABS
1.0000 | ORAL_TABLET | Freq: Once | ORAL | Status: AC
Start: 2014-08-26 — End: 2014-08-26
  Administered 2014-08-26: 1 via ORAL
  Filled 2014-08-26: qty 1

## 2014-08-26 NOTE — ED Notes (Signed)
Pt states she has a toothache  Pt states she was seen at [facility] on Tuesday and was given antibiotic and pain medication  Pt states she has not gotten the medication filled yet  Pt states she was here to see if she could get a numbing shot for the pain until she can get her medication tomorrow

## 2014-08-26 NOTE — ED Notes (Signed)
Pt has a ride home.  

## 2014-08-26 NOTE — ED Provider Notes (Signed)
CSN: [id number]     Arrival date & time 08/26/14  2014 History   First MD Initiated Contact with Patient 08/26/14 2136    This chart was scribed for non-physician practitioner, Mellody DrownLauren Kelle Ruppert, PA working with Ethelda ChickMartha K Linker, MD by Marica OtterNusrat Rahman, ED Scribe. This patient was seen in room WTR8/WTR8 and the patient's care was started at 9:47 PM.   Chief Complaint  Patient presents with  . Dental Pain   HPI Comments: Carolyn Harmon is a 23 y.o. female who presents to the Emergency Department complaining of atraumatic, intermittent left lower posterior dental pain onset few months ago. Pt denies fever, HA, neck pain. Pt notes that she went to a dentist today, however, denies that the dentist prescribed any meds or did any procedures for her dental pain. [facility]14 and was prescribed antibiotics and pain meds. Pt notes that she has not filled her prescriptions yet due the cost of the medications $20 total for narcotic and amoxicillin. Pt notes, however, that she has been taking ibuprofen around the clock and using orajel without relief. Pt also complains of sleep disturbance due to her dental pain, stating "I have not slept since Tuesday."    The history is provided by the patient. No language interpreter was used.    History reviewed. No pertinent past medical history. History reviewed. No pertinent past surgical history. History reviewed. No pertinent family history. History  Substance Use Topics  . Smoking status: Never Smoker   . Smokeless tobacco: Not on file  . Alcohol Use: Yes   OB History    No data available     Review of Systems  Constitutional: Negative for fever and chills.  HENT: Positive for dental problem.   Neurological: Negative for headaches.  Psychiatric/Behavioral: Negative for confusion.   Allergies  Review of patient's allergies indicates no known allergies.  Home Medications   Prior to Admission medications   Medication  Sig Start Date End Date Taking? Authorizing Provider  amoxicillin (AMOXIL) 500 MG capsule Take 1 capsule (500 mg total) by mouth 3 (three) times daily. 08/25/14   Suzi RootsKevin E Steinl, MD  HYDROcodone-acetaminophen (NORCO/VICODIN) 5-325 MG per tablet Take 1-2 tablets by mouth every 6 (six) hours as needed for moderate pain. 08/25/14   Suzi RootsKevin E Steinl, MD   Triage Vitals: BP 122/88 mmHg  Pulse 79  Temp(Src) 97.9 F (36.6 C) (Oral)  Resp 18  SpO2 100%  LMP 08/15/2014 (Approximate) Physical Exam  Constitutional: She is oriented to person, place, and time. She appears well-developed and well-nourished. No distress.  HENT:  Head: Normocephalic and atraumatic.  Mouth/Throat: Uvula is midline and oropharynx is clear and moist. No trismus in the jaw. Abnormal dentition.    Tenderness to palpation of the base of tooth 17. No palpable abscess, overlying erythema or drainage noted. Tooth #17 and 32 partially impacted.  Eyes: Conjunctivae and EOM are normal.  Neck: Neck supple.  Pulmonary/Chest: Effort normal. No respiratory distress.  Musculoskeletal: Normal range of motion.  Neurological: She is alert and oriented to person, place, and time.  Skin: Skin is warm and dry.  Psychiatric: She has a normal mood and affect. Her behavior is normal.  Nursing note and vitals reviewed.   ED Course  Procedures (including critical care time) 9:51 PM-Discussed treatment plan which includes meds with pt at bedside and pt agreed to plan.   Labs Review Labs Reviewed - No data to display  Imaging  Review No results found.   EKG Interpretation None      MDM   Final diagnoses:  Pain, dental   Patient presents with persistent dental pain, seen by dentist today no procedure performed, advised patient to have with some teeth removed. Patient was also seen in ED 1 day ago for similar complaints has not filled pain medication or antibiotic. Encouraged patient to fill prescriptions and follow-up with oral  surgeon as advised.  I personally performed the services described in this documentation, which was scribed in my presence. The recorded information has been reviewed and is accurate.     Mellody DrownLauren Sky Borboa, PA-C 08/27/14 [id number]  Ethelda ChickMartha K Linker, MD 08/27/14 [phone]

## 2014-08-26 NOTE — Discharge Instructions (Signed)
Call for a follow up appointment with your dentist. Soft diet to reduce discomfort. Return if Symptoms worsen.   Take medication as prescribed.

## 2014-10-07 ENCOUNTER — Emergency Department (HOSPITAL_COMMUNITY)
Admission: EM | Admit: 2014-10-07 | Discharge: 2014-10-08 | Disposition: A | Discharge status: Home / Self Care | Payer: Self-pay | Attending: Emergency Medicine | Admitting: Emergency Medicine

## 2014-10-07 ENCOUNTER — Emergency Department (HOSPITAL_COMMUNITY): Payer: Self-pay

## 2014-10-07 ENCOUNTER — Encounter (HOSPITAL_COMMUNITY): Payer: Self-pay | Admitting: Emergency Medicine

## 2014-10-07 DIAGNOSIS — Z792 Long term (current) use of antibiotics: Secondary | ICD-10-CM | POA: Insufficient documentation

## 2014-10-07 DIAGNOSIS — K5900 Constipation, unspecified: Secondary | ICD-10-CM | POA: Insufficient documentation

## 2014-10-07 DIAGNOSIS — N390 Urinary tract infection, site not specified: Secondary | ICD-10-CM | POA: Insufficient documentation

## 2014-10-07 LAB — URINALYSIS, ROUTINE W REFLEX MICROSCOPIC
Bilirubin Urine: NEGATIVE
Glucose, UA: NEGATIVE mg/dL
Ketones, ur: >80 mg/dL — AB
NITRITE: POSITIVE — AB
Protein, ur: >300 mg/dL — AB
Specific Gravity, Urine: 1.019 (ref 1.005–1.030)
UROBILINOGEN UA: 1 mg/dL (ref 0.0–1.0)
pH: 7.5 (ref 5.0–8.0)

## 2014-10-07 LAB — CBC WITH DIFFERENTIAL/PLATELET
BASOS PCT: 0 % (ref 0–1)
Basophils Absolute: 0 10*3/uL (ref 0.0–0.1)
Eosinophils Absolute: 0 10*3/uL (ref 0.0–0.7)
Eosinophils Relative: 0 % (ref 0–5)
HCT: 39.3 % (ref 36.0–46.0)
Hemoglobin: 13.1 g/dL (ref 12.0–15.0)
LYMPHS ABS: 0.9 10*3/uL (ref 0.7–4.0)
Lymphocytes Relative: 11 % — ABNORMAL LOW (ref 12–46)
MCH: 29.5 pg (ref 26.0–34.0)
MCHC: 33.3 g/dL (ref 30.0–36.0)
MCV: 88.5 fL (ref 78.0–100.0)
MONO ABS: 0.6 10*3/uL (ref 0.1–1.0)
MONOS PCT: 7 % (ref 3–12)
NEUTROS ABS: 6.7 10*3/uL (ref 1.7–7.7)
Neutrophils Relative %: 82 % — ABNORMAL HIGH (ref 43–77)
PLATELETS: 243 10*3/uL (ref 150–400)
RBC: 4.44 MIL/uL (ref 3.87–5.11)
RDW: 13.8 % (ref 11.5–15.5)
WBC: 8.2 10*3/uL (ref 4.0–10.5)

## 2014-10-07 LAB — URINE MICROSCOPIC-ADD ON

## 2014-10-07 MED ORDER — MINERAL OIL RE ENEM
1.0000 | ENEMA | Freq: Once | RECTAL | Status: AC
  Administered 2014-10-08: 1 via RECTAL
  Filled 2014-10-07: qty 1

## 2014-10-07 NOTE — ED Notes (Signed)
Patient transported to X-ray 

## 2014-10-07 NOTE — ED Notes (Signed)
Patient states that she has not been able to have a BM x 1 week and is having abdominal pain from that. States that she was also dx with a UTI and just picked up her antibiotics today. Alert and oriented.

## 2014-10-08 LAB — COMPREHENSIVE METABOLIC PANEL
ALK PHOS: 40 U/L (ref 39–117)
ALT: 13 U/L (ref 0–35)
AST: 21 U/L (ref 0–37)
Albumin: 4.2 g/dL (ref 3.5–5.2)
Anion gap: 7 (ref 5–15)
BILIRUBIN TOTAL: 0.5 mg/dL (ref 0.3–1.2)
BUN: 12 mg/dL (ref 6–23)
CALCIUM: 8.8 mg/dL (ref 8.4–10.5)
CHLORIDE: 103 mmol/L (ref 96–112)
CO2: 28 mmol/L (ref 19–32)
CREATININE: 0.67 mg/dL (ref 0.50–1.10)
GFR calc Af Amer: >90 mL/min (ref >90)
GFR calc non Af Amer: >90 mL/min (ref >90)
Glucose, Bld: 108 mg/dL — ABNORMAL HIGH (ref 70–99)
Potassium: 3.3 mmol/L — ABNORMAL LOW (ref 3.5–5.1)
Sodium: 138 mmol/L (ref 135–145)
TOTAL PROTEIN: 7.4 g/dL (ref 6.0–8.3)

## 2014-10-08 LAB — LIPASE, BLOOD: Lipase: 25 U/L (ref 11–59)

## 2014-10-08 MED ORDER — NITROFURANTOIN MONOHYD MACRO 100 MG PO CAPS: 100.0000 mg | ORAL_CAPSULE | Freq: Two times a day (BID) | ORAL | Status: DC

## 2014-10-08 MED ORDER — POLYETHYLENE GLYCOL 3350 17 G PO PACK: 17.0000 g | PACK | Freq: Every day | ORAL | Status: DC

## 2014-10-08 NOTE — Discharge Instructions (Signed)
Urinary Tract Infection Urinary tract infections (UTIs) can develop anywhere along your urinary tract. Your urinary tract is your body's drainage system for removing wastes and extra water. Your urinary tract includes two kidneys, two ureters, a bladder, and a urethra. Your kidneys are a pair of bean-shaped organs. Each kidney is about the size of your fist. They are located below your ribs, one on each side of your spine. CAUSES Infections are caused by microbes, which are microscopic organisms, including fungi, viruses, and bacteria. These organisms are so small that they can only be seen through a microscope. Bacteria are the microbes that most commonly cause UTIs. SYMPTOMS  Symptoms of UTIs may vary by age and gender of the patient and by the location of the infection. Symptoms in young women typically include a frequent and intense urge to urinate and a painful, burning feeling in the bladder or urethra during urination. Older women and men are more likely to be tired, shaky, and weak and have muscle aches and abdominal pain. A fever may mean the infection is in your kidneys. Other symptoms of a kidney infection include pain in your back or sides below the ribs, nausea, and vomiting. DIAGNOSIS To diagnose a UTI, your caregiver will ask you about your symptoms. Your caregiver also will ask to provide a urine sample. The urine sample will be tested for bacteria and white blood cells. White blood cells are made by your body to help fight infection. TREATMENT  Typically, UTIs can be treated with medication. Because most UTIs are caused by a bacterial infection, they usually can be treated with the use of antibiotics. The choice of antibiotic and length of treatment depend on your symptoms and the type of bacteria causing your infection. HOME CARE INSTRUCTIONS  If you were prescribed antibiotics, take them exactly as your caregiver instructs you. Finish the medication even if you feel better after you  have only taken some of the medication.  Drink enough water and fluids to keep your urine clear or pale yellow.  Avoid caffeine, tea, and carbonated beverages. They tend to irritate your bladder.  Empty your bladder often. Avoid holding urine for long periods of time.  Empty your bladder before and after sexual intercourse.  After a bowel movement, women should cleanse from front to back. Use each tissue only once. SEEK MEDICAL CARE IF:   You have back pain.  You develop a fever.  Your symptoms do not begin to resolve within 3 days. SEEK IMMEDIATE MEDICAL CARE IF:   You have severe back pain or lower abdominal pain.  You develop chills.  You have nausea or vomiting.  You have continued burning or discomfort with urination. MAKE SURE YOU:   Understand these instructions.  Will watch your condition.  Will get help right away if you are not doing well or get worse. Document Released: 06/06/2005 Document Revised: 02/26/2012 Document Reviewed: 10/05/2011 [facility] Patient Information 2015 [place], [place]. This information is not intended to replace advice given to you by your health care provider. Make sure you discuss any questions you have with your health care provider.  Constipation Constipation is when a person has fewer than three bowel movements a week, has difficulty having a bowel movement, or has stools that are dry, hard, or larger than normal. As people grow older, constipation is more common. If you try to fix constipation with medicines that make you have a bowel movement (laxatives), the problem may get worse. Long-term laxative use may cause the  muscles of the colon to become weak. A low-fiber diet, not taking in enough fluids, and taking certain medicines may make constipation worse.  CAUSES   Certain medicines, such as antidepressants, pain medicine, iron supplements, antacids, and water pills.   Certain diseases, such as diabetes, irritable bowel syndrome  (IBS), thyroid disease, or depression.   Not drinking enough water.   Not eating enough fiber-rich foods.   Stress or travel.   Lack of physical activity or exercise.   Ignoring the urge to have a bowel movement.   Using laxatives too much.  SIGNS AND SYMPTOMS   Having fewer than three bowel movements a week.   Straining to have a bowel movement.   Having stools that are hard, dry, or larger than normal.   Feeling full or bloated.   Pain in the lower abdomen.   Not feeling relief after having a bowel movement.  DIAGNOSIS  Your health care provider will take a medical history and perform a physical exam. Further testing may be done for severe constipation. Some tests may include:  A barium enema X-ray to examine your rectum, colon, and, sometimes, your small intestine.   A sigmoidoscopy to examine your lower colon.   A colonoscopy to examine your entire colon. TREATMENT  Treatment will depend on the severity of your constipation and what is causing it. Some dietary treatments include drinking more fluids and eating more fiber-rich foods. Lifestyle treatments may include regular exercise. If these diet and lifestyle recommendations do not help, your health care provider may recommend taking over-the-counter laxative medicines to help you have bowel movements. Prescription medicines may be prescribed if over-the-counter medicines do not work.  HOME CARE INSTRUCTIONS   Eat foods that have a lot of fiber, such as fruits, vegetables, whole grains, and beans.  Limit foods high in fat and processed sugars, such as french fries, hamburgers, cookies, candies, and soda.   A fiber supplement may be added to your diet if you cannot get enough fiber from foods.   Drink enough fluids to keep your urine clear or pale yellow.   Exercise regularly or as directed by your health care provider.   Go to the restroom when you have the urge to go. Do not hold it.   Only  take over-the-counter or prescription medicines as directed by your health care provider. Do not take other medicines for constipation without talking to your health care provider first.  SEEK IMMEDIATE MEDICAL CARE IF:   You have bright red blood in your stool.   Your constipation lasts for more than 4 days or gets worse.   You have abdominal or rectal pain.   You have thin, pencil-like stools.   You have unexplained weight loss. MAKE SURE YOU:   Understand these instructions.  Will watch your condition.  Will get help right away if you are not doing well or get worse. Document Released: 05/25/2004 Document Revised: 09/01/2013 Document Reviewed: 06/08/2013 [facility] Patient Information 2015 [place], [place]. This information is not intended to replace advice given to you by your health care provider. Make sure you discuss any questions you have with your health care provider.   Emergency Department Resource Guide 1) Find a Doctor and Pay Out of Pocket Although you won't have to find out who is covered by your insurance plan, it is a good idea to ask around and get recommendations. You will then need to call the office and see if the doctor you have chosen will accept  you as a new patient and what types of options they offer for patients who are self-pay. Some doctors offer discounts or will set up payment plans for their patients who do not have insurance, but you will need to ask so you aren't surprised when you get to your appointment.  2) Contact Your Local Health Department Not all health departments have doctors that can see patients for sick visits, but many do, so it is worth a call to see if yours does. If you don't know where your local health department is, you can check in your phone book. The CDC also has a tool to help you locate your state's health department, and many state websites also have listings of all of their local health departments.  3) Find a Walk-in  Clinic If your illness is not likely to be very severe or complicated, you may want to try a walk in clinic. These are popping up all over the country in pharmacies, drugstores, and shopping centers. They're usually staffed by nurse practitioners or physician assistants that have been trained to treat common illnesses and complaints. They're usually fairly quick and inexpensive. However, if you have serious medical issues or chronic medical problems, these are probably not your best option.  No Primary Care Doctor: - Call Health Connect at  [phone] - they can help you locate a primary care doctor that  accepts your insurance, provides certain services, etc. - Physician Referral Service- [phone]  Chronic Pain Problems: Organization         Address  Phone   Notes  [facility]  [phone] Patients need to be referred by their primary care doctor.   Medication Assistance: Organization         Address  Phone   Notes  [facility] [address] [place], [place] [address] [phone]) [phone] --Must be a resident of [place] -- Must have NO insurance coverage whatsoever (no Medicaid/ Medicare, etc.) -- The pt. MUST have a primary care doctor that directs their care regularly and follows them in the community   MedAssist  [phone]   [employer]  [phone]    Agencies that provide inexpensive medical care: Organization         Address  Phone   Notes  [facility]  [phone]   [facility]    [phone]   [facility] [address] [place], [place] [address] [phone]   [facility] [address], [place] [phone]) [phone]   [facility]anned Parenthood    [phone]   [facility]    [phone]   [facility]  [address], [place] Phone:  [phone], Fax:  [phone] Hours  of Operation:  9 am - 6 pm, M-F.  Also accepts Medicaid/Medicare and self-pay.  [facility]  [address], [place] Phone: [phone], Fax: [phone]. Hours of Operation:  8:30 am - 5:30 pm, M-F.  Also accepts Medicaid and self-pay.  [facility] [place] [address], [place] Phone: [phone]) [phone]   [facility] [address], [place], [place] [phone], Ext. [phone] Mondays & Thursdays: 7-9 AM.  First 15 patients are seen on a first come, first serve basis.    Medicaid-accepting [facility] Providers:  Organization  Address  Phone   Notes  [facility] [address], [place] [phone] Also accepts self-pay patients.  [facility] [address] [place], [place]  [phone]   [place], [address], [place] [phone]   [facility] [address], [place] [phone]   Lucianne Lei [address], [place]   [phone] Only accepts [place] Access [place] patients after they have their name applied to their card.   Self-Pay (no insurance) in [facility]:  Organization         Address  Phone   Notes  Sickle Cell Patients, [facility] [place] [phone]   [facility] [place] [phone]   [facility] [place], [place], [place] [phone]   [facility]/Dr. Osei-Bonsu  [address], [place] [address], [place] [phone] Phone number for both [place] and [place] locations is the same.  [facility]gent Medical and [facility] [place] [address], [place] [phone]   [facility] [address] [place] or [address] [phone]   [facility] [address] [place] [phone], phone; [phone], fax Sees patients 1st and 3rd Saturday of every month.  Must not qualify for public or private insurance (i.e. Medicaid, Medicare, [place] Health Choice, Veterans' Benefits)  Household income should be no more than 200% of the poverty level The clinic cannot treat you if you are pregnant or think you are pregnant  Sexually transmitted diseases are not treated at the clinic.    Dental Care: Organization         Address  Phone  Notes  [facility] Department of [facility] [place] [phone] Accepts children up to age 72 who are enrolled in [place] or [place]; pregnant women with a Medicaid card; and children who have applied for Medicaid or [place] Health Choice, but were declined, whose parents can pay a reduced fee at time of service.  [facility] Department of [facility]  [address], [place] [phone] Accepts children up to age 53 who are enrolled in [place] or [place]; pregnant women with a Medicaid card; and children who have applied for Medicaid or [place] Health Choice, but were declined, whose parents can pay a reduced fee at time of service.  [place] Adult Dental Access PROGRAM  [place] [phone] Patients are seen by appointment only. Walk-ins are not accepted. [place] will see patients 65 years of age and older. Monday - Tuesday (8am-5pm) Most Wednesdays (8:30-5pm) $30 per visit, cash only  [facility] Adult Dental Access PROGRAM  [address], [facility] [phone] Patients are seen by appointment only. Walk-ins are not accepted. [place] will see patients 72 years of age and older. One Wednesday Evening (Monthly: Volunteer Based).  $30 per visit, cash only  [place]  [phone] for adults; Children under age 82, call Graduate  Pediatric Dentistry at [phone]. Children aged 86-14, please call [phone] to request a pediatric application.  Dental services are provided in all areas of dental care including  fillings, crowns and bridges, complete and partial dentures, implants, gum treatment, root canals, and extractions. Preventive care is also provided. Treatment is provided to both adults and children. Patients are selected via a lottery and there is often a waiting list.   [facility] [address] [place]  [phone] [email or web address]   [facility] [address] [place], [place] [phone], Ext. [phone] Second and Fourth Thursday of each month, opens at 6:30 AM; Clinic ends at 9 AM.  Patients are seen on a first-come first-served basis, and a limited number are seen during each clinic.   [facility]  [address] Hillard Danker [place], [place] [phone]   Eligibility Requirements You must have lived in [place], [place], or [place] counties for at least the last three months.   You cannot be eligible for state or federal sponsored [employer], including [employer], [place], or [employer].   You generally cannot be eligible for healthcare insurance through your employer.    How to apply: Eligibility screenings are held every Tuesday and Wednesday afternoon from 1:00 pm until 4:00 pm. You do not need an appointment for the interview!  [facility] [address], [place], [place]   [place]  [place] Department  [place]  [phone]    Behavioral Health Resources in the Community: Intensive Outpatient Programs Organization         Address  Phone  Notes  [facility] [place]. [address] [place], [place] [phone]   [facility] Outpatient [address], [place], [place]   ADS: Alcohol & Drug S[facility]cs [address], [place], [place]   [place] [address]  [place], [place] or [phone]   Substance Abuse Resources Organization         Address  Phone  Notes  Alcohol and Drug Services  [phone]   [place]  [phone]   [facility] [place]   Chinita Pester  [phone]   Residential & Outpatient Substance Abuse Program  [phone]   Psychological Services Organization         Address  Phone  Notes  [facility] [place]  [place]  [phone]   [place] [address], [place] or [phone]    Mobile Crisis Teams Organization         Address  Phone  Notes  [facility], [place] Crisis Care Unit  [phone]   [facility]  [address] [place], [place]   [place] Levels [address] [place] [address] [place] [phone]    Self-Help/Support Groups Organization         Address  Phone             Notes  [place]. of [place] - variety of support groups  [place] Call for more information  Narcotics Anonymous (NA), [facility] [address], [employer] [place]  2 meetings at this location   Special educational needs teacher         Address  Phone  Notes  [facility] [place],    [place]  [phone]   [facility]  [address] [place] [phone], [place], [place]   [place], [place] [phone] Admissions: 8am-3pm M-F  [facility] [address],    [place], [place] [phone]   [facility] [address] [place], [place], [place] [phone]   [facility] [address],  [place], [place] [phone]   [facility] - Intensive Outpatient [address], [place], [place] [phone]   [facility] ([facility]iction [facility] [address] [place].,  [place], [place] [phone] or [phone]   [facility] (RTS) [address] [place], [place] [phone] Accepts Medicaid  [facility] [place] [address]  [place] [phone] Substance Abuse/Addiction Treatment   [facility] Organization         Address  Phone  Notes  [facility]  [phone]   Angie Fava, PhD [address] Ervin Knack [place], [place]   [phone] or [phone]   [facility]   [address] [place], [place] [phone]   [facility] [address] [place], [place] [phone] Insurance/Medicaid/sponsorship through [facility] and Families [address]                                    [place], [place] [phone] Therapy/tele-psych/case  [facility] [address], [place] [phone]    Dr. Lolly Mustache  [phone]   [facility] [place]  [employer] [facility] Dept. 1) [address], [place] 2) [address] [place] 3)  [address], [place] [phone]  [phone]   [facility] Child Abuse Hotline [phone] [phone] or [phone] (After Hours)

## 2014-10-08 NOTE — ED Provider Notes (Signed)
CSN: [id number]     Arrival date & time 10/07/14  2206 History   First MD Initiated Contact with Patient 10/07/14 2251     Chief Complaint  Patient presents with  . Constipation   HPI  Patient is a 24 year old female who presents emergency room for evaluation of constipation and dysuria x one week. Patient states that for the past week she has had generalized abdominal pain and has been able to have a satisfying bowel movements. She feels that her stools have been like small pellets and she has felt constipated. She has tried milk of magnesia and mag citrate at home with little relief. She is also tried drinking lots of water and eating high-fiber foods. She denies any nausea or vomiting. She also admits to some urinary frequency, urgency, and mild dysuria. She tried taking penicillin which she had left over from dental pain. Nothing has made her feel significantly better. Patient states her pain is currently a 7 out of 10. Patient states that she has had no vaginal discharge. She is not sexually active. She has no history of STDs. Last period was 09/24/2014.  History reviewed. No pertinent past medical history. History reviewed. No pertinent past surgical history. History reviewed. No pertinent family history. History  Substance Use Topics  . Smoking status: Never Smoker   . Smokeless tobacco: Not on file  . Alcohol Use: Yes   OB History    No data available     Review of Systems  Constitutional: Negative for fever, chills and fatigue.  Respiratory: Negative for chest tightness and shortness of breath.   Cardiovascular: Negative for chest pain and palpitations.  Gastrointestinal: Positive for constipation. Negative for nausea, vomiting, abdominal pain, diarrhea, blood in stool and anal bleeding.  Genitourinary: Positive for dysuria, urgency and frequency. Negative for hematuria, decreased urine volume, vaginal bleeding, vaginal discharge and difficulty urinating.  Musculoskeletal:  Positive for back pain.  All other systems reviewed and are negative.     Allergies  Review of patient's allergies indicates no known allergies.  Home Medications   Prior to Admission medications   Medication Sig Start Date End Date Taking? Authorizing Provider  HYDROcodone-acetaminophen (NORCO/VICODIN) 5-325 MG per tablet Take 1-2 tablets by mouth every 6 (six) hours as needed for moderate pain. 08/25/14  Yes Suzi RootsKevin E Steinl, MD  amoxicillin (AMOXIL) 500 MG capsule Take 1 capsule (500 mg total) by mouth 3 (three) times daily. Patient not taking: Reported on 10/07/2014 08/25/14   Suzi RootsKevin E Steinl, MD  amoxicillin (AMOXIL) 500 MG capsule Take 500 mg by mouth 3 (three) times daily.    Historical Provider, MD  nitrofurantoin, macrocrystal-monohydrate, (MACROBID) 100 MG capsule Take 1 capsule (100 mg total) by mouth 2 (two) times daily. X 7 days 10/08/14   Olivya Sobol A Forcucci, PA-C  polyethylene glycol (MIRALAX / GLYCOLAX) packet Take 17 g by mouth daily. 10/08/14   Keyla Milone A Forcucci, PA-C   BP 120/80 mmHg  Pulse 100  Temp(Src) 97.5 F (36.4 C) (Oral)  SpO2 100%  LMP 09/24/2014 Physical Exam  ED Course  Procedures (including critical care time) Labs Review Labs Reviewed  CBC WITH DIFFERENTIAL/PLATELET - Abnormal; Notable for the following:    Neutrophils Relative % 82 (*)    Lymphocytes Relative 11 (*)    All other components within normal limits  COMPREHENSIVE METABOLIC PANEL - Abnormal; Notable for the following:    Potassium 3.3 (*)    Glucose, Bld 108 (*)    All other components within  normal limits  URINALYSIS, ROUTINE W REFLEX MICROSCOPIC - Abnormal; Notable for the following:    APPearance TURBID (*)    Hgb urine dipstick LARGE (*)    Ketones, ur >80 (*)    Protein, ur >300 (*)    Nitrite POSITIVE (*)    Leukocytes, UA LARGE (*)    All other components within normal limits  LIPASE, BLOOD  URINE MICROSCOPIC-ADD ON    Imaging Review Dg Abd 1 View  10/07/2014    CLINICAL DATA:  Abdominal pain and constipation for 1 week.  Nausea.  EXAM: ABDOMEN - 1 VIEW  COMPARISON:  None.  FINDINGS: The bowel gas pattern is normal. No radio-opaque calculi or other significant radiographic abnormality are seen.  IMPRESSION: Negative.   Electronically Signed   By: Burman Nieves M.D.   On: 10/07/2014 23:20     EKG Interpretation None      MDM   Final diagnoses:  Constipation, unspecified constipation type  UTI (lower urinary tract infection)   Patient's 24 year old female who presents emergency room for evaluation of constipation and urinary symptoms. Abdomen is soft and nontender with no guarding, rigidity, or peritoneal signs. CBC is unremarkable. CMP is negative. Lipase is negative. UA reveals UTI and is nitrite and leukocyte positive. CBC and CMP are unremarkable. Abdominal x-ray reveals no obstructions. Patient given mineral oil enema here. She was able to have a small bowel movement here. We'll discharge home with MiraLAX and Keflex. Patient to return for intractable nausea and vomiting, flank pain, difficulty urinating, or any other concerning symptoms. Patient states understanding and agreement at this time. Patient stable to be discharged home.    Eben Burow, PA-C 10/08/14 0041  Eben Burow, PA-C 10/08/14 0109  Gerhard Munch, MD 10/10/14 1416

## 2014-10-08 NOTE — ED Notes (Signed)
Pt went to restroom, stated she did not have a BM

## 2015-11-29 ENCOUNTER — Encounter (HOSPITAL_COMMUNITY): Payer: Self-pay | Admitting: *Deleted

## 2015-11-29 ENCOUNTER — Emergency Department (HOSPITAL_COMMUNITY)
Admission: EM | Admit: 2015-11-29 | Discharge: 2015-11-29 | Disposition: A | Discharge status: Home / Self Care | Payer: Self-pay | Attending: Emergency Medicine | Admitting: Emergency Medicine

## 2015-11-29 DIAGNOSIS — Z79899 Other long term (current) drug therapy: Secondary | ICD-10-CM | POA: Insufficient documentation

## 2015-11-29 DIAGNOSIS — R05 Cough: Secondary | ICD-10-CM | POA: Insufficient documentation

## 2015-11-29 DIAGNOSIS — J029 Acute pharyngitis, unspecified: Secondary | ICD-10-CM | POA: Insufficient documentation

## 2015-11-29 DIAGNOSIS — R0981 Nasal congestion: Secondary | ICD-10-CM | POA: Insufficient documentation

## 2015-11-29 DIAGNOSIS — R079 Chest pain, unspecified: Secondary | ICD-10-CM | POA: Insufficient documentation

## 2015-11-29 DIAGNOSIS — R6889 Other general symptoms and signs: Secondary | ICD-10-CM

## 2015-11-29 NOTE — ED Notes (Signed)
C/o sore throat onset last tues. stges she lost her voice and now has cough and nasal congestion

## 2015-11-29 NOTE — Discharge Instructions (Signed)
Upper Respiratory Infection, Adult Most upper respiratory infections (URIs) are a viral infection of the air passages leading to the lungs. A URI affects the nose, throat, and upper air passages. The most common type of URI is nasopharyngitis and is typically referred to as "the common cold." URIs run their course and usually go away on their own. Most of the time, a URI does not require medical attention, but sometimes a bacterial infection in the upper airways can follow a viral infection. This is called a secondary infection. Sinus and middle ear infections are common types of secondary upper respiratory infections. Bacterial pneumonia can also complicate a URI. A URI can worsen asthma and chronic obstructive pulmonary disease (COPD). Sometimes, these complications can require emergency medical care and may be life threatening.  CAUSES Almost all URIs are caused by viruses. A virus is a type of germ and can spread from one person to another.  RISKS FACTORS You may be at risk for a URI if:   You smoke.   You have chronic heart or lung disease.  You have a weakened defense (immune) system.   You are very young or very old.   You have nasal allergies or asthma.  You work in crowded or poorly ventilated areas.  You work in health care facilities or schools. SIGNS AND SYMPTOMS  Symptoms typically develop 2-3 days after you come in contact with a cold virus. Most viral URIs last 7-10 days. However, viral URIs from the influenza virus (flu virus) can last 14-18 days and are typically more severe. Symptoms may include:   Runny or stuffy (congested) nose.   Sneezing.   Cough.   Sore throat.   Headache.   Fatigue.   Fever.   Loss of appetite.   Pain in your forehead, behind your eyes, and over your cheekbones (sinus pain).  Muscle aches.  DIAGNOSIS  Your health care provider may diagnose a URI by:  Physical exam.  Tests to check that your symptoms are not due to  another condition such as:  Strep throat.  Sinusitis.  Pneumonia.  Asthma. TREATMENT  A URI goes away on its own with time. It cannot be cured with medicines, but medicines may be prescribed or recommended to relieve symptoms. Medicines may help:  Reduce your fever.  Reduce your cough.  Relieve nasal congestion. HOME CARE INSTRUCTIONS   Take medicines only as directed by your health care provider.   Gargle warm saltwater or take cough drops to comfort your throat as directed by your health care provider.  Use a warm mist humidifier or inhale steam from a shower to increase air moisture. This may make it easier to breathe.  Drink enough fluid to keep your urine clear or pale yellow.   Eat soups and other clear broths and maintain good nutrition.   Rest as needed.   Return to work when your temperature has returned to normal or as your health care provider advises. You may need to stay home longer to avoid infecting others. You can also use a face mask and careful hand washing to prevent spread of the virus.  Increase the usage of your inhaler if you have asthma.   Do not use any tobacco products, including cigarettes, chewing tobacco, or electronic cigarettes. If you need help quitting, ask your health care provider. PREVENTION  The best way to protect yourself from getting a cold is to practice good hygiene.   Avoid oral or hand contact with people with cold   symptoms.   Wash your hands often if contact occurs.  There is no clear evidence that vitamin C, vitamin E, echinacea, or exercise reduces the chance of developing a cold. However, it is always recommended to get plenty of rest, exercise, and practice good nutrition.  SEEK MEDICAL CARE IF:   You are getting worse rather than better.   Your symptoms are not controlled by medicine.   You have chills.  You have worsening shortness of breath.  You have brown or red mucus.  You have yellow or brown nasal  discharge.  You have pain in your face, especially when you bend forward.  You have a fever.  You have swollen neck glands.  You have pain while swallowing.  You have white areas in the back of your throat. SEEK IMMEDIATE MEDICAL CARE IF:   You have severe or persistent:  Headache.  Ear pain.  Sinus pain.  Chest pain.  You have chronic lung disease and any of the following:  Wheezing.  Prolonged cough.  Coughing up blood.  A change in your usual mucus.  You have a stiff neck.  You have changes in your:  Vision.  Hearing.  Thinking.  Mood. MAKE SURE YOU:   Understand these instructions.  Will watch your condition.  Will get help right away if you are not doing well or get worse.   This information is not intended to replace advice given to you by your health care provider. Make sure you discuss any questions you have with your health care provider.   Document Released: 02/20/2001 Document Revised: 01/11/2015 Document Reviewed: 12/02/2013 Elsevier Interactive Patient Education 2016 [employer]  

## 2015-11-29 NOTE — ED Provider Notes (Signed)
CSN: [id number]     Arrival date & time 11/29/15  1220 History   By signing my name below, I, Freida Busman, attest that this documentation has been prepared under the direction and in the presence of non-physician practitioner, Arthor Captain, PA-C. Electronically Signed: Freida Busman, Scribe. 11/29/2015. 3:05 PM.      Chief Complaint  Patient presents with  . Cough  . Nasal Congestion     The history is provided by the patient. No language interpreter was used.     HPI Comments:  Carolyn Harmon is a 25 y.o. female who presents to the Emergency Department complaining of persistent cough x ~ 1 week. Pt reports associated nasal congestion and sore throat secondary to cough. She has been taking Theraflu, Dayquil, and Nyquil with mild relief. She denies fever and chills. Pt states she has been out of work on leave x 5 days and is looking for a work note so she can return to work.   History reviewed. No pertinent past medical history. History reviewed. No pertinent past surgical history. History reviewed. No pertinent family history. Social History  Substance Use Topics  . Smoking status: Never Smoker   . Smokeless tobacco: None  . Alcohol Use: Yes   OB History    No data available     Review of Systems  Constitutional: Negative for fever and chills.  HENT: Positive for congestion and sore throat.   Cardiovascular: Positive for chest pain.    Allergies  Review of patient's allergies indicates no known allergies.  Home Medications   Prior to Admission medications   Medication Sig Start Date End Date Taking? Authorizing Provider  amoxicillin (AMOXIL) 500 MG capsule Take 1 capsule (500 mg total) by mouth 3 (three) times daily. Patient not taking: Reported on 10/07/2014 08/25/14   Cathren Laine, MD  amoxicillin (AMOXIL) 500 MG capsule Take 500 mg by mouth 3 (three) times daily.    Historical Provider, MD  HYDROcodone-acetaminophen (NORCO/VICODIN) 5-325 MG per tablet Take 1-2  tablets by mouth every 6 (six) hours as needed for moderate pain. 08/25/14   Cathren Laine, MD  nitrofurantoin, macrocrystal-monohydrate, (MACROBID) 100 MG capsule Take 1 capsule (100 mg total) by mouth 2 (two) times daily. X 7 days 10/08/14   Terri Piedra, PA-C  polyethylene glycol (MIRALAX / GLYCOLAX) packet Take 17 g by mouth daily. 10/08/14   Courtney Forcucci, PA-C   BP 118/91 mmHg  Pulse 92  Temp(Src) 98.8 F (37.1 C) (Oral)  Resp 16  Wt 117 lb (53.071 kg)  SpO2 100%  LMP 11/15/2015 Physical Exam  Constitutional: She is oriented to person, place, and time. She appears well-developed and well-nourished. No distress.  HENT:  Head: Normocephalic and atraumatic.  Eyes: Conjunctivae are normal.  Cardiovascular: Normal rate.   Pulmonary/Chest: Effort normal.  Abdominal: She exhibits no distension.  Neurological: She is alert and oriented to person, place, and time.  Skin: Skin is warm and dry.  Psychiatric: She has a normal mood and affect.  Nursing note and vitals reviewed.   ED Course  Procedures   DIAGNOSTIC STUDIES:  Oxygen Saturation is 100% on RA, normal by my interpretation.    COORDINATION OF CARE:  2:38 PM Discussed treatment plan with pt at bedside and pt agreed to plan.   MDM  Pt symptoms consistent with flu-like illness. Pt will be discharged with symptomatic treatment.  Discussed return precautions.  Pt is hemodynamically stable & in NAD prior to discharge.  Final diagnoses:  Flu-like symptoms  I personally performed the services described in this documentation, which was scribed in my presence. The recorded information has been reviewed and is accurate.      Arthor Captainbigail Latorria Zeoli, PA-C 11/30/15 1705  Laurence Spatesachel Morgan Little, MD 12/01/15 [phone]

## 2015-11-29 NOTE — ED Notes (Signed)
Declined W/C at D/C and was escorted to lobby by RN. 

## 2015-11-30 ENCOUNTER — Encounter: Payer: Self-pay | Admitting: Medical

## 2015-11-30 ENCOUNTER — Telehealth: Payer: Self-pay | Admitting: General Practice

## 2015-11-30 NOTE — Progress Notes (Signed)
This encounter was created in error - please disregard.

## 2015-11-30 NOTE — Telephone Encounter (Signed)
No charge. 

## 2015-11-30 NOTE — Telephone Encounter (Signed)
Patient called and cancelled 3pm appointment due to father having a dentist appointment and her father is her transportation. Patient RSC to 12/01/15. Charge or no charge

## 2015-12-01 ENCOUNTER — Encounter: Payer: Self-pay | Admitting: Medical

## 2015-12-01 ENCOUNTER — Ambulatory Visit (INDEPENDENT_AMBULATORY_CARE_PROVIDER_SITE_OTHER): Payer: Self-pay | Admitting: Medical

## 2015-12-01 VITALS — BP 100/68 | Temp 98.1°F | Ht 64.25 in | Wt 118.0 lb

## 2015-12-01 DIAGNOSIS — J111 Influenza due to unidentified influenza virus with other respiratory manifestations: Secondary | ICD-10-CM

## 2015-12-01 NOTE — Patient Instructions (Addendum)
Flu syndrome that appears to be resolved.   Pt needs form filled out. I will fill  out since  employer states necessary. But  illness was self limiting and you are now better. Paper work has questions as if fmla form?   Follow up in here as needed. Will try to get your forms filled out by early April

## 2015-12-01 NOTE — Progress Notes (Signed)
Subjective:    Patient ID: Carolyn Harmon, female    DOB: 11-Jan-1991, 25 y.o.   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 No children  Pt in states about 10 days ago she had mild st but then got worse. She got cough later and next morning had diffuse body aches and fatigue. Pt states went to ED on 21st and diagnosed with flu syndrome. They advised symptomatic treatment. Pt states now feeling better. Pt has missed work since November 22, 2015. Pt wants to return to work on Friday. She needs me to fill out form since she missed more than 3 days.   LMP- November 13, 2015.     Review of Systems  Constitutional: Negative for fever, chills and fatigue.  HENT: Negative for congestion.   Respiratory: Negative for cough and choking.   Cardiovascular: Negative for chest pain and palpitations.  Gastrointestinal: Negative for abdominal pain.  Musculoskeletal: Negative for myalgias and joint swelling.  Skin: Negative for rash.  Neurological: Negative for dizziness and headaches.  Hematological: Negative for adenopathy. Does not bruise/bleed easily.  Psychiatric/Behavioral: Negative for behavioral problems and confusion.   History reviewed. No pertinent past medical history.  Social History   Social History  . Marital Status: Single    Spouse Name: N/A  . Number of Children: N/A  . Years of Education: N/A   Occupational History  . Not on file.   Social History Main Topics  . Smoking status: Never Smoker   . Smokeless tobacco: Not on file  . Alcohol Use: Yes     Comment: occasional glass of wine  . Drug Use: No  . Sexual Activity: Yes    Birth Control/ Protection: Pill   Other Topics Concern  . Not on file   Social History Narrative    History reviewed. No pertinent past surgical history.  History reviewed. No pertinent family  history.  No Known Allergies  No current outpatient prescriptions on file prior to visit.   No current facility-administered medications on file prior to visit.    BP 100/68 mmHg  Temp(Src) 98.1 F (36.7 C) (Oral)  Ht 5' 4.25" (1.632 m)  Wt 118 lb (53.524 kg)  BMI 20.10 kg/m2  LMP 11/15/2015       Objective:   Physical Exam   General  Mental Status - Alert. General Appearance - Well groomed. Not in acute distress.  Skin Rashes- No Rashes.  HEENT Head- Normal. Ear Auditory Canal - Left- Normal. Right - Normal.Tympanic Membrane- Left- Normal. Right- Normal. Eye Sclera/Conjunctiva- Left- Normal. Right- Normal. Nose & Sinuses Nasal Mucosa- Left-  Boggy and Congested. Right-  Boggy and  Congested.Bilateral no  maxillary and  No frontal sinus pressure. Mouth & Throat Lips: Upper Lip- Normal: no dryness, cracking, pallor, cyanosis, or vesicular eruption. Lower Lip-Normal: no dryness, cracking, pallor, cyanosis or vesicular eruption. Buccal Mucosa- Bilateral- No Aphthous ulcers. Oropharynx- No Discharge or Erythema. Tonsils: Characteristics- Bilateral- No Erythema or Congestion. Size/Enlargement- Bilateral- No enlargement. Discharge- bilateral-None.  Neck Neck- Supple. No Masses.   Chest and Lung Exam Auscultation: Breath Sounds:-Clear even and unlabored.  Cardiovascular Auscultation:Rythm- Regular, rate and rhythm. Murmurs & Other Heart Sounds:Ausculatation of the heart reveal- No Murmurs.  Lymphatic Head & Neck General Head & Neck Lymphatics: Bilateral: Description- No Localized lymphadenopathy.      Assessment & Plan:  Flu  syndrome that appears to be resolved.   Pt needs form filled out. I will out since employer states necessary. But her  illness was self limiting and you are now better. Paper work has question as if fmla form?  Follow up in here as needed. Will try to get your forms filled out by early April  Pt returning to work on Friday with ED MD  return note. I will fill out other forms.  I advised pt I would explain that she had flu syndrome and that her condition is now resolved.

## 2015-12-01 NOTE — Progress Notes (Signed)
Pre visit review using our clinic review tool, if applicable. No additional management support is needed unless otherwise documented below in the visit note. 

## 2015-12-14 ENCOUNTER — Telehealth: Payer: Self-pay | Admitting: Medical

## 2015-12-14 NOTE — Telephone Encounter (Signed)
Spoke with pt and advised her that forms were ready for pickup and pt states that she would like them faxed to her work. Pt was advised that forms would be sent 12/14/15 to [phone].

## 2015-12-14 NOTE — Telephone Encounter (Signed)
Caller name: Self  Can be reached:[phone]  Reason for call: Patient called stating that she is waiting for the Forms she needs for her job. States she was told on 3/23 when she saw Ramon Dredgedward for a New Patient appointment that he would complete them. Plse adv.

## 2016-05-13 ENCOUNTER — Encounter (HOSPITAL_COMMUNITY): Payer: Self-pay

## 2016-05-13 ENCOUNTER — Emergency Department (HOSPITAL_COMMUNITY)
Admission: EM | Admit: 2016-05-13 | Discharge: 2016-05-13 | Disposition: A | Discharge status: Home / Self Care | Payer: Self-pay | Attending: Emergency Medicine | Admitting: Emergency Medicine

## 2016-05-13 ENCOUNTER — Emergency Department (HOSPITAL_COMMUNITY): Payer: Self-pay

## 2016-05-13 DIAGNOSIS — N390 Urinary tract infection, site not specified: Secondary | ICD-10-CM | POA: Insufficient documentation

## 2016-05-13 LAB — URINE MICROSCOPIC-ADD ON

## 2016-05-13 LAB — LIPASE, BLOOD: Lipase: 23 U/L (ref 11–51)

## 2016-05-13 LAB — CBC
HCT: 42.7 % (ref 36.0–46.0)
HEMOGLOBIN: 14 g/dL (ref 12.0–15.0)
MCH: 29.5 pg (ref 26.0–34.0)
MCHC: 32.8 g/dL (ref 30.0–36.0)
MCV: 89.9 fL (ref 78.0–100.0)
Platelets: 251 10*3/uL (ref 150–400)
RBC: 4.75 MIL/uL (ref 3.87–5.11)
RDW: 13.2 % (ref 11.5–15.5)
WBC: 6.8 10*3/uL (ref 4.0–10.5)

## 2016-05-13 LAB — URINALYSIS, ROUTINE W REFLEX MICROSCOPIC
BILIRUBIN URINE: NEGATIVE
GLUCOSE, UA: NEGATIVE mg/dL
Ketones, ur: NEGATIVE mg/dL
NITRITE: POSITIVE — AB
PH: 6 (ref 5.0–8.0)
Protein, ur: 100 mg/dL — AB
SPECIFIC GRAVITY, URINE: 1.021 (ref 1.005–1.030)

## 2016-05-13 LAB — COMPREHENSIVE METABOLIC PANEL
ALT: 12 U/L — ABNORMAL LOW (ref 14–54)
ANION GAP: 5 (ref 5–15)
AST: 19 U/L (ref 15–41)
Albumin: 4.3 g/dL (ref 3.5–5.0)
Alkaline Phosphatase: 36 U/L — ABNORMAL LOW (ref 38–126)
BUN: 13 mg/dL (ref 6–20)
CO2: 24 mmol/L (ref 22–32)
Calcium: 9.2 mg/dL (ref 8.9–10.3)
Chloride: 107 mmol/L (ref 101–111)
Creatinine, Ser: 0.69 mg/dL (ref 0.44–1.00)
GFR calc Af Amer: >60 mL/min (ref >60)
GFR calc non Af Amer: >60 mL/min (ref >60)
Glucose, Bld: 97 mg/dL (ref 65–99)
Potassium: 3.6 mmol/L (ref 3.5–5.1)
SODIUM: 136 mmol/L (ref 135–145)
Total Bilirubin: 0.5 mg/dL (ref 0.3–1.2)
Total Protein: 7.3 g/dL (ref 6.5–8.1)

## 2016-05-13 LAB — I-STAT BETA HCG BLOOD, ED (MC, WL, AP ONLY): I-stat hCG, quantitative: <5 m[IU]/mL (ref <5)

## 2016-05-13 MED ORDER — CEPHALEXIN 500 MG PO CAPS: 500.0000 mg | ORAL_CAPSULE | Freq: Four times a day (QID) | ORAL | 0 refills | Status: DC

## 2016-05-13 MED ORDER — NAPROXEN 375 MG PO TABS: 375.0000 mg | ORAL_TABLET | Freq: Two times a day (BID) | ORAL | 0 refills | Status: DC

## 2016-05-13 MED ORDER — ONDANSETRON 4 MG PO TBDP
4.0000 mg | ORAL_TABLET | Freq: Once | ORAL | Status: AC
  Administered 2016-05-13: 4 mg via ORAL
  Filled 2016-05-13: qty 1

## 2016-05-13 MED ORDER — PHENAZOPYRIDINE HCL 200 MG PO TABS: 200.0000 mg | ORAL_TABLET | Freq: Three times a day (TID) | ORAL | 0 refills | Status: DC

## 2016-05-13 MED ORDER — KETOROLAC TROMETHAMINE 30 MG/ML IJ SOLN
30.0000 mg | Freq: Once | INTRAMUSCULAR | Status: AC
  Administered 2016-05-13: 30 mg via INTRAMUSCULAR
  Filled 2016-05-13: qty 1

## 2016-05-13 NOTE — ED Triage Notes (Signed)
Patient complains of RLQ abdominal pain since yesterday afternoon. States that the pain has been persistent with nausea. No vomiting, no diarrhea. Denies fever. No urinary symptoms

## 2016-05-13 NOTE — Discharge Instructions (Signed)
Your CT scan was normal. Your labs show that you do have an infection in your urine. Take the medications as directed and follow up with your health care provider. We have sent the urine for culture. If we need to change your medication someone will call you.

## 2016-05-13 NOTE — ED Provider Notes (Signed)
[facility] Provider Note   CSN: [id number] Arrival date & time: 05/13/16  1406  By signing my name below, I, Soijett Blue, attest that this documentation has been prepared under the direction and in the presence of Kerrie BuffaloHope Shelbey Spindler, NP Electronically Signed: Soijett Blue, ED Scribe. 05/13/16. 4:06 PM.    History   Chief Complaint Chief Complaint  Patient presents with  . Abdominal Pain    HPI  Carolyn Harmon is a 25 y.o. female  who presents to the Emergency Department complaining of 6/10 lower abdominal pain onset yesterday afternoon. Pt states that this morning she had 10/10 sharp/throbbing RLQ pain that has gradually decreased since then. Pt notes that she has had a UTI in the past and she thinks that her current symptoms are similar to her past UTI episode. Pt notes that her LMP was 2-3 days ago. Pt is sexually active and she denies using contraceptives at this time. Pt states that she used to take birth control pills, but she d/c 6 months ago due to them causing her to have headaches. Pt reports that she always uses condoms with sexual intercourse and she has been with her sexual partner x 2 years. Pt denies concern for STD's at this time. She states that she is having associated symptoms of mild right flank pain, bladder pressure, nausea, and mild constipation. Pt notes that her last bowel movement was today but it was hard. She states that she has tried heating pad and cranberry juice without medications for the relief for her symptoms. She denies dysuria, vomiting, diarrhea, and any other symptoms. Pt denies any PMHx at this time.     The history is provided by the patient. No language interpreter was used.  Abdominal Pain   This is a new problem. The current episode started yesterday. The problem occurs rarely. The problem has been gradually improving. The pain is associated with an unknown factor. The pain is located in the RLQ (sharp). The quality of the pain is throbbing. The  pain is at a severity of 6/10. The pain is moderate. Associated symptoms include nausea and constipation. Pertinent negatives include fever, diarrhea, vomiting, dysuria, frequency and headaches. Nothing aggravates the symptoms. Nothing relieves the symptoms.    History reviewed. No pertinent past medical history.  There are no active problems to display for this patient.   History reviewed. No pertinent surgical history.  OB History    No data available       Home Medications    Prior to Admission medications   Medication Sig Start Date End Date Taking? Authorizing Provider  cephALEXin (KEFLEX) 500 MG capsule Take 1 capsule (500 mg total) by mouth 4 (four) times daily. 05/13/16   Dallas Scorsone Orlene OchM Shaquita Fort, NP  naproxen (NAPROSYN) 375 MG tablet Take 1 tablet (375 mg total) by mouth 2 (two) times daily. 05/13/16   Breeonna Mone Orlene OchM Mina Carlisi, NP  phenazopyridine (PYRIDIUM) 200 MG tablet Take 1 tablet (200 mg total) by mouth 3 (three) times daily. 05/13/16   Dodge Ator Orlene OchM Neely Kammerer, NP    Family History No family history on file.  Social History Social History  Substance Use Topics  . Smoking status: Never Smoker  . Smokeless tobacco: Never Used  . Alcohol use Yes     Comment: occasional glass of wine     Allergies   Review of patient's allergies indicates no known allergies.   Review of Systems Review of Systems  Constitutional: Negative for fever.  HENT: Negative.   Gastrointestinal: Positive  for abdominal pain (RLQ), constipation and nausea. Negative for diarrhea and vomiting.  Genitourinary: Negative for dysuria, frequency, vaginal bleeding, vaginal discharge and vaginal pain.       Pressure sensation when urinating.   Musculoskeletal: Positive for back pain.  Skin: Negative for rash and wound.  Neurological: Negative for syncope and headaches.  Psychiatric/Behavioral: The patient is not nervous/anxious.   All other systems reviewed and are negative.    Physical Exam Updated Vital Signs BP 114/90 (BP  Location: Left Arm)   Pulse 103   Temp 98.4 F (36.9 C) (Oral)   Resp 18   SpO2 100%   Physical Exam  Constitutional: She is oriented to person, place, and time. She appears well-developed and well-nourished. No distress.  HENT:  Head: Normocephalic and atraumatic.  Right Ear: Tympanic membrane, external ear and ear canal normal.  Left Ear: Tympanic membrane, external ear and ear canal normal.  Mouth/Throat: Uvula is midline, oropharynx is clear and moist and mucous membranes are normal. No posterior oropharyngeal edema or posterior oropharyngeal erythema.  Eyes: EOM are normal.  Neck: Normal range of motion. Neck supple.  Cardiovascular: Regular rhythm.  Tachycardia present.   Pulmonary/Chest: Effort normal. No respiratory distress. She has no wheezes. She has no rales.  Abdominal: Soft. Bowel sounds are normal. There is tenderness in the right lower quadrant and suprapubic area. There is CVA tenderness (right).  Right CVA tenderness  Genitourinary:  Genitourinary Comments: Patient declined pelvic exam. States no d/c and no concern for STI's.   Musculoskeletal: Normal range of motion.  Neurological: She is alert and oriented to person, place, and time.  Skin: Skin is warm and dry.  Psychiatric: She has a normal mood and affect. Her behavior is normal.  Nursing note and vitals reviewed.    ED Treatments / Results  DIAGNOSTIC STUDIES: Oxygen Saturation is 100% on RA, nl by my interpretation.    COORDINATION OF CARE: 4:05 PM Discussed treatment plan with pt at bedside and pt agreed to plan.  4:10 PM- Discussed U/A results and lab results. Discussed reasons for abdominal pain with hematuria and pt states that she was concerned about possible kidney stones and that she would like a CT scan for further evaluation.   Labs Results for orders placed or performed during the hospital encounter of 05/13/16 (from the past 24 hour(s))  Lipase, blood     Status: None   Collection Time:  05/13/16  2:19 PM  Result Value Ref Range   Lipase 23 11 - 51 U/L  Comprehensive metabolic panel     Status: Abnormal   Collection Time: 05/13/16  2:19 PM  Result Value Ref Range   Sodium 136 135 - 145 mmol/L   Potassium 3.6 3.5 - 5.1 mmol/L   Chloride 107 101 - 111 mmol/L   CO2 24 22 - 32 mmol/L   Glucose, Bld 97 65 - 99 mg/dL   BUN 13 6 - 20 mg/dL   Creatinine, Ser 4.09 0.44 - 1.00 mg/dL   Calcium 9.2 8.9 - 81.1 mg/dL   Total Protein 7.3 6.5 - 8.1 g/dL   Albumin 4.3 3.5 - 5.0 g/dL   AST 19 15 - 41 U/L   ALT 12 (L) 14 - 54 U/L   Alkaline Phosphatase 36 (L) 38 - 126 U/L   Total Bilirubin 0.5 0.3 - 1.2 mg/dL   GFR calc non Af Amer >60 >60 mL/min   GFR calc Af Amer >60 >60 mL/min   Anion gap 5  5 - 15  CBC     Status: None   Collection Time: 05/13/16  2:19 PM  Result Value Ref Range   WBC 6.8 4.0 - 10.5 K/uL   RBC 4.75 3.87 - 5.11 MIL/uL   Hemoglobin 14.0 12.0 - 15.0 g/dL   HCT 78.4 69.6 - 29.5 %   MCV 89.9 78.0 - 100.0 fL   MCH 29.5 26.0 - 34.0 pg   MCHC 32.8 30.0 - 36.0 g/dL   RDW 28.4 13.2 - 44.0 %   Platelets 251 150 - 400 K/uL  Urinalysis, Routine w reflex microscopic     Status: Abnormal   Collection Time: 05/13/16  2:21 PM  Result Value Ref Range   Color, Urine YELLOW YELLOW   APPearance TURBID (A) CLEAR   Specific Gravity, Urine 1.021 1.005 - 1.030   pH 6.0 5.0 - 8.0   Glucose, UA NEGATIVE NEGATIVE mg/dL   Hgb urine dipstick LARGE (A) NEGATIVE   Bilirubin Urine NEGATIVE NEGATIVE   Ketones, ur NEGATIVE NEGATIVE mg/dL   Protein, ur 102 (A) NEGATIVE mg/dL   Nitrite POSITIVE (A) NEGATIVE   Leukocytes, UA LARGE (A) NEGATIVE  Urine microscopic-add on     Status: Abnormal   Collection Time: 05/13/16  2:21 PM  Result Value Ref Range   Squamous Epithelial / LPF 0-5 (A) NONE SEEN   WBC, UA TOO NUMEROUS TO COUNT 0 - 5 WBC/hpf   RBC / HPF 6-30 0 - 5 RBC/hpf   Bacteria, UA MANY (A) NONE SEEN  I-Stat beta hCG blood, ED     Status: None   Collection Time: 05/13/16  2:47  PM  Result Value Ref Range   I-stat hCG, quantitative <5.0 <5 mIU/mL   Comment 3            Radiology Ct Renal Stone Study  Result Date: 05/13/2016 CLINICAL DATA:  Right flank pain since yesterday. Nausea. Hematuria. EXAM: CT ABDOMEN AND PELVIS WITHOUT CONTRAST TECHNIQUE: Multidetector CT imaging of the abdomen and pelvis was performed following the standard protocol without IV contrast. COMPARISON:  None. FINDINGS: Lower chest:  No acute findings. Hepatobiliary: No mass visualized on this un-enhanced exam. Gallbladder is unremarkable. Pancreas: No mass or inflammatory process identified on this un-enhanced exam. Spleen: Within normal limits in size. Adrenals/Urinary Tract: No evidence of urolithiasis or hydronephrosis. Unopacified urinary bladder is unremarkable in appearance. Stomach/Bowel: No evidence of obstruction, inflammatory process, or abnormal fluid collections. Vascular/Lymphatic: No pathologically enlarged lymph nodes. No evidence of abdominal aortic aneurysm. Reproductive: No mass or other significant abnormality. Other: None. Musculoskeletal:  No suspicious bone lesions identified. IMPRESSION: No evidence of urolithiasis, hydronephrosis, or other acute findings. Electronically Signed   By: Myles Rosenthal M.D.   On: 05/13/2016 17:54    Procedures Procedures (including critical care time)  Medications Ordered in ED Medications  ondansetron (ZOFRAN-ODT) disintegrating tablet 4 mg (4 mg Oral Given 05/13/16 1812)  ketorolac (TORADOL) 30 MG/ML injection 30 mg (30 mg Intramuscular Given 05/13/16 1813)     Initial Impression / Assessment and Plan / ED Course  I have reviewed the triage vital signs and the nursing notes.  Pertinent labs & imaging results that were available during my care of the patient were reviewed by me and considered in my medical decision making (see chart for details).  Clinical Course    Pt diagnosed with a UTI. Pt is afebrile does not appear toxic or have other  signs of serious infection.  Pt to be dc home with  antibiotics and instructions to follow up with PCP if symptoms persist. Discussed return precautions. Pt appears safe for discharge. Urine culture pending.   Final Clinical Impressions(s) / ED Diagnoses   Final diagnoses:  UTI (lower urinary tract infection)    New Prescriptions New Prescriptions   CEPHALEXIN (KEFLEX) 500 MG CAPSULE    Take 1 capsule (500 mg total) by mouth 4 (four) times daily.   NAPROXEN (NAPROSYN) 375 MG TABLET    Take 1 tablet (375 mg total) by mouth 2 (two) times daily.   PHENAZOPYRIDINE (PYRIDIUM) 200 MG TABLET    Take 1 tablet (200 mg total) by mouth 3 (three) times daily.   I personally performed the services described in this documentation, which was scribed in my presence. The recorded information has been reviewed and is accurate.     [address] [place], NP 05/13/16 1835    Jacalyn Lefevre, MD 05/13/16 [phone]

## 2016-05-13 NOTE — ED Notes (Signed)
Declined W/C at D/C and was escorted to lobby by RN. 

## 2016-05-15 LAB — URINE CULTURE

## 2016-05-16 ENCOUNTER — Telehealth (HOSPITAL_BASED_OUTPATIENT_CLINIC_OR_DEPARTMENT_OTHER): Payer: Self-pay | Admitting: Emergency Medicine

## 2016-05-16 NOTE — Telephone Encounter (Signed)
Post ED Visit - Positive Culture Follow-up  Culture report reviewed by antimicrobial stewardship pharmacist:  []  Carolyn Harmon, Pharm.D. []  Carolyn Harmon, Pharm.D., BCPS []  Carolyn Harmon, Pharm.D. []  Carolyn Harmon, Pharm.D., BCPS []  KearnyMinh Harmon, [address]Pharm.D., BCPS, AAHIVP []  Estella HuskMichelle Turner, Pharm.D., BCPS, AAHIVP []  Tennis Mustassie Stewart, Pharm.D. []  Sherle Poeob Vincent, [address]Ph[address]Rolly Salter. Emily Stewart PharmD  Positive urine culture Treated with cephalexin, organism sensitive to the same and no further patient follow-up is required at this time.  Berle MullMiller, Malayzia Laforte 05/16/2016, 11:55 AM   Berle MullMiller, Seraphina Mitchner 05/16/2016,

## 2016-12-26 ENCOUNTER — Encounter: Payer: Self-pay | Admitting: Medical

## 2016-12-26 ENCOUNTER — Ambulatory Visit (INDEPENDENT_AMBULATORY_CARE_PROVIDER_SITE_OTHER): Payer: Self-pay | Admitting: Medical

## 2016-12-26 VITALS — BP 108/80 | HR 87 | Temp 97.9°F | Resp 16 | Ht 64.0 in | Wt 117.0 lb

## 2016-12-26 DIAGNOSIS — Z01818 Encounter for other preprocedural examination: Secondary | ICD-10-CM

## 2016-12-26 NOTE — Progress Notes (Signed)
Pre visit review using our clinic review tool, if applicable. No additional management support is needed unless otherwise documented below in the visit note. 

## 2016-12-26 NOTE — Progress Notes (Signed)
Subjective:    Patient ID: Carolyn Harmon, female    DOB: 1991-01-28, 26 y.o.   MRN: [id number]  HPI   Pt in for evaluation.  She states she is getting breast augmentation.   Pt states surgery date is Jan 16, 2017.  Pt had wisdom teeth taken out but no report of general anesthesia.   No family history any sudden death or any reaction to anesthesia.   No history of syncope. No history of asthma. No murmur history of cardiac condition. Pt walks every other day.  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  Pt had some pre-op labs normal inr, normal cbc, normal platelets,  And negative hiv.  Pt having surgery done in [place]. She will be there 5 days post op.    Review of Systems  Constitutional: Negative for chills, fatigue and fever.  Respiratory: Negative for cough, chest tightness, shortness of breath and wheezing.   Cardiovascular: Negative for chest pain and palpitations.  Gastrointestinal: Negative for abdominal pain, anal bleeding, constipation, diarrhea and vomiting.  Musculoskeletal: Negative for back pain and neck stiffness.  Skin: Negative for rash.  Neurological: Negative for dizziness, syncope, speech difficulty, weakness, light-headedness and headaches.  Hematological: Negative for adenopathy. Does not bruise/bleed easily.  Psychiatric/Behavioral: Negative for behavioral problems, confusion, dysphoric mood, self-injury and suicidal ideas. The patient is not nervous/anxious.     No past medical history on file.   Social History   Social History  . Marital status: Single    Spouse name: N/A  . Number of children: N/A  . Years of education: N/A   Occupational History  . Not on file.   Social History Main Topics  . Smoking status: Never Smoker  . Smokeless tobacco: Never Used  . Alcohol use Yes     Comment: occasional glass of wine  . Drug use: No  . Sexual activity: Yes    Birth control/  protection: Pill   Other Topics Concern  . Not on file   Social History Narrative  . No narrative on file    No past surgical history on file.  No family history on file.  No Known Allergies  No current outpatient prescriptions on file prior to visit.   No current facility-administered medications on file prior to visit.     BP 108/80 (BP Location: Left Arm, Patient Position: Sitting, Cuff Size: Normal)   Pulse 87   Temp 97.9 F (36.6 C) (Oral)   Resp 16   Ht  (1.626 m)   Wt 117 lb (53.1 kg)   LMP 12/18/2014   SpO2 100%   BMI 20.08 kg/m       Objective:   Physical Exam  General Mental Status- Alert. General Appearance- Not in acute distress.   Skin General: Color- Normal Color. Moisture- Normal Moisture.  Neck Carotid Arteries- Normal color. Moisture- Normal Moisture. No carotid bruits. No JVD.  Chest and Lung Exam Auscultation: Breath Sounds:-Normal.  Cardiovascular Auscultation:Rythm- Regular. Murmurs & Other Heart Sounds:Auscultation of the heart reveals- No Murmurs.  Abdomen Inspection:-Inspeection Normal. Palpation/Percussion:Note:No mass. Palpation and Percussion of the abdomen reveal- Non Tender, Non Distended + BS, no rebound or guarding.    Neurologic Cranial Nerve exam:- CN III-XII intact(No nystagmus), symmetric smile. Strength:- 5/5 equal and symmetric strength both upper and lower extremities.      Assessment & Plan:  Your labs recently look normal, you have no worrisome medical history and have not been ill  recently. Your ekg looks normal as ell.  I see no contraindication for surgery and feel that it is safe and reasonable to clear you for surgery in light of above.   Notify anesthesiologist or surgeon of any change in your general condition or acute illness prior to your surgery in May.  You might also consider staying little longer post op as surgical complications following up here may be difficult.   Follow up with me  as needed  Carolyn Harmon, Carolyn Dredge, PA-C

## 2016-12-26 NOTE — Patient Instructions (Signed)
Your labs recently look normal, you have no worrisome medical history and have not been ill recently. Your ekg looks normal as ell.  I see no contraindication for surgery and feel that it is safe and reasonable to clear you for surgery in light of above.   Notify anesthesiologist or surgeon of any change in your general condition or acute illness prior to your surgery in May.  You might also consider staying little longer post op as surgical complications following up here may be difficult.   Follow up with me as needed

## 2017-02-18 IMAGING — CT CT RENAL STONE PROTOCOL
2 of 4 series · 17 of 46 positions shown, 19 images · non-contrast
Comparison: None.

CLINICAL DATA: Right flank pain since yesterday. Nausea. Hematuria.

EXAM:
CT ABDOMEN AND PELVIS WITHOUT CONTRAST
TECHNIQUE: Multidetector CT imaging of the abdomen and pelvis was performed
following the standard protocol without IV contrast.

[Series 2: stone study 5.0 i30f 1 · axial · 0.56mm/px · z∈[-426,-71]mm · 14 of 79 slices shown, 16 images]
[im 4/79  soft-tissue]
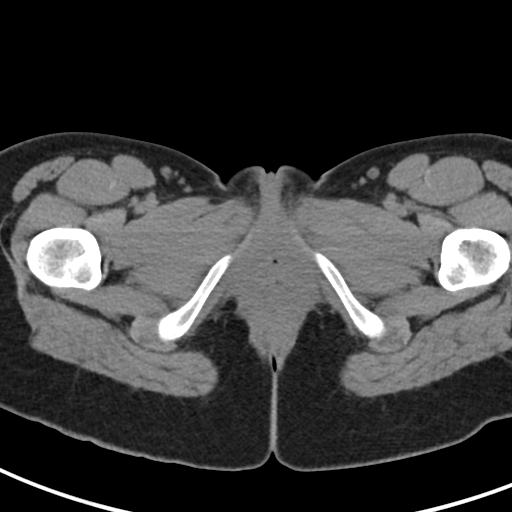
[im 4/79  bone]
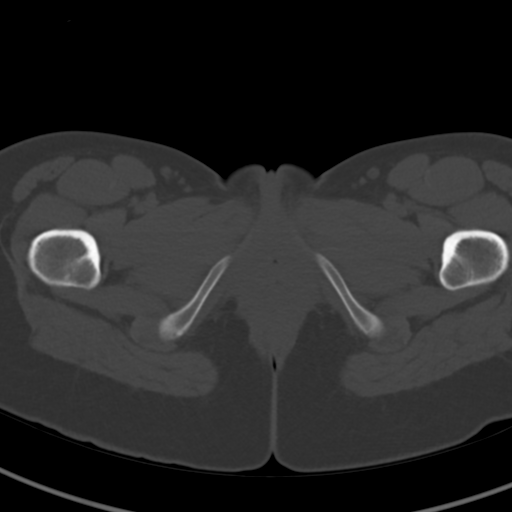
[im 10/79  soft-tissue]
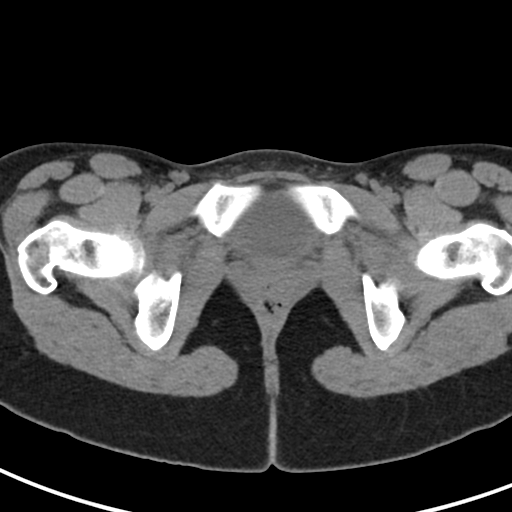
[im 16/79  soft-tissue]
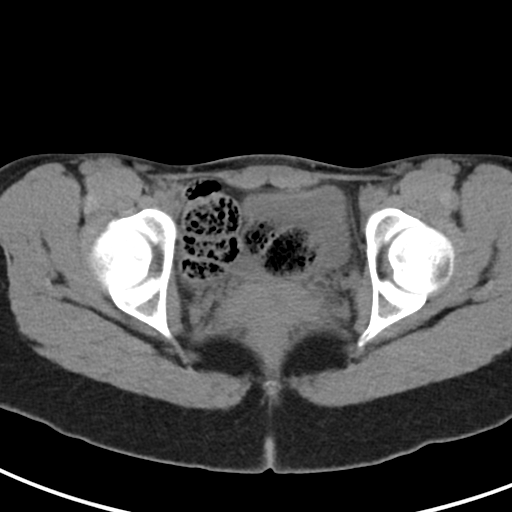
[im 22/79  soft-tissue]
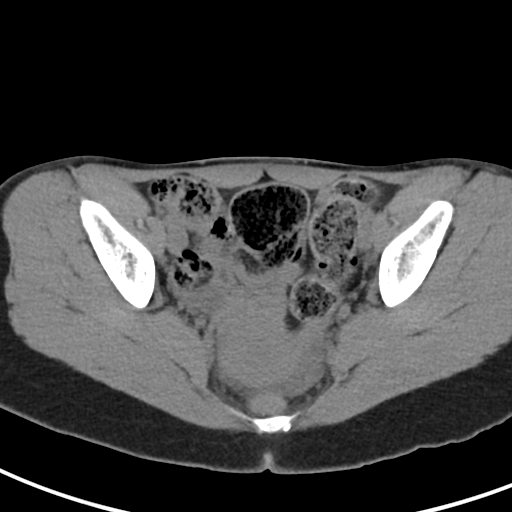
[im 25/79  soft-tissue]
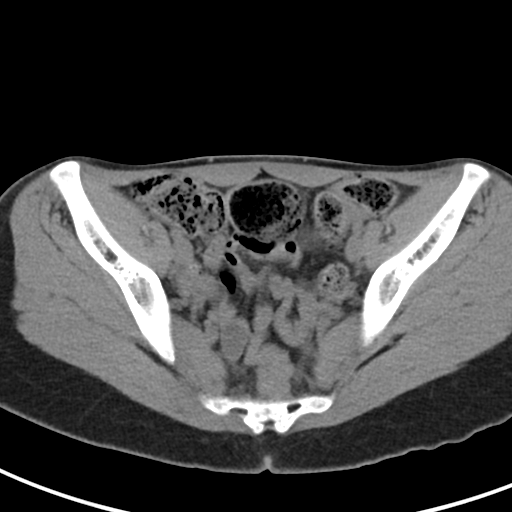
[im 32/79  soft-tissue]
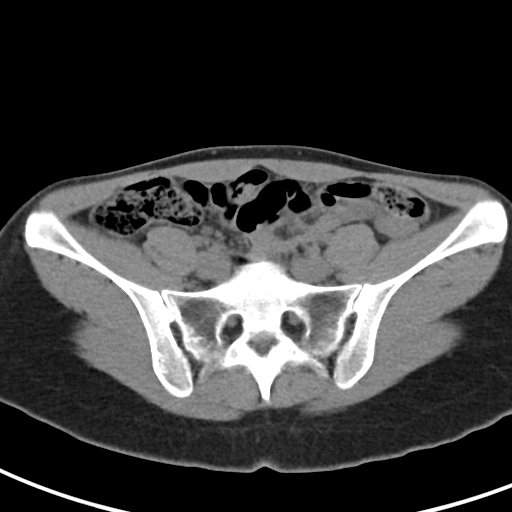
[im 38/79  soft-tissue]
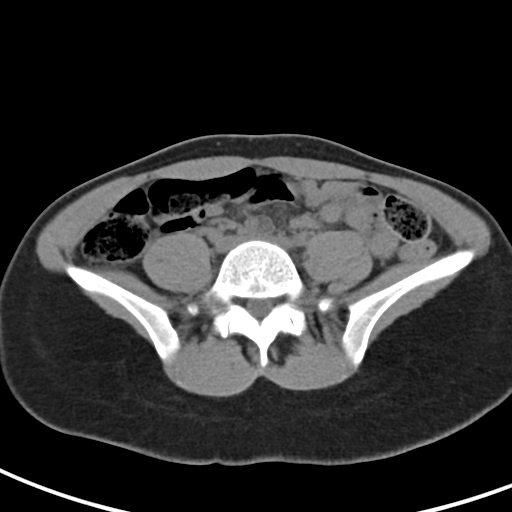
[im 41/79  soft-tissue]
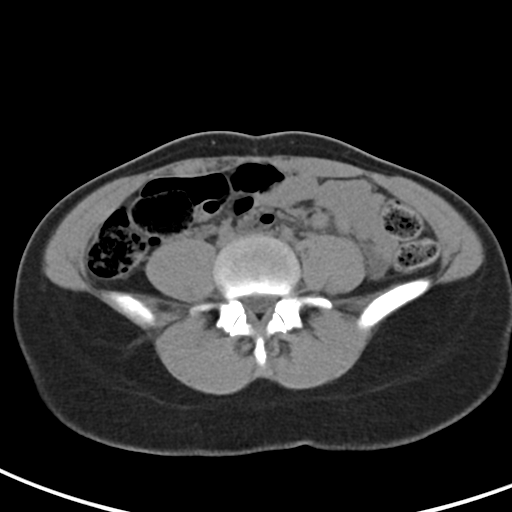
[im 47/79  soft-tissue]
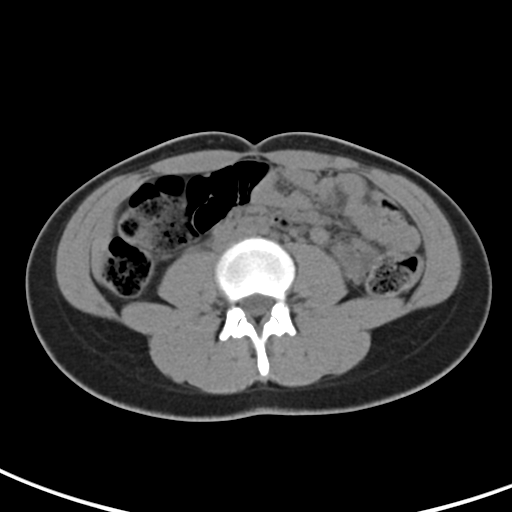
[im 47/79  bone]
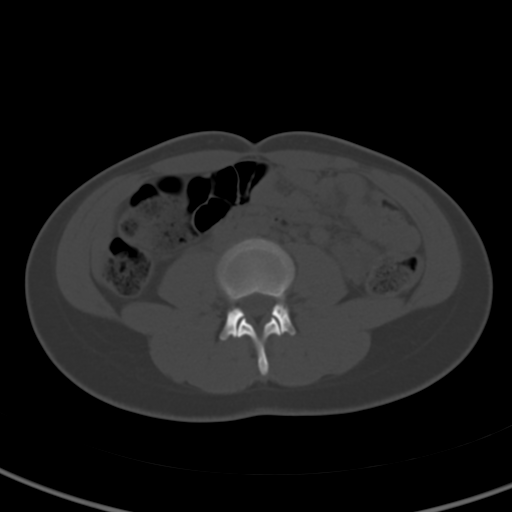
[im 54/79  soft-tissue]
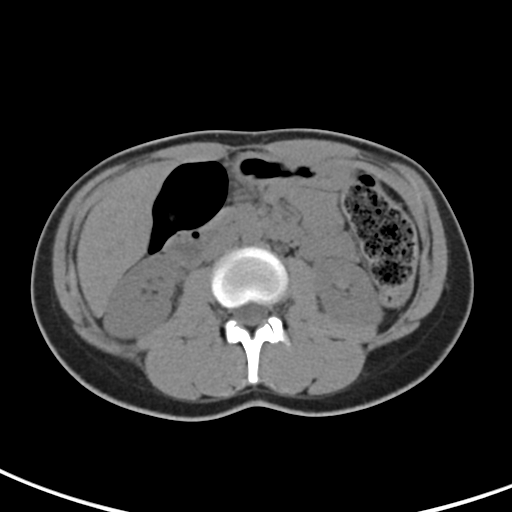
[im 60/79  soft-tissue]
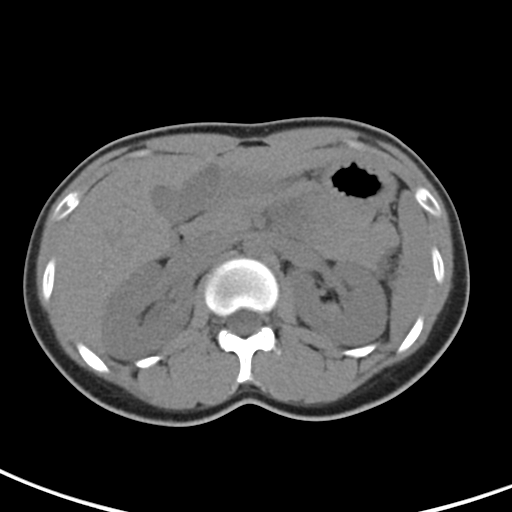
[im 63/79  soft-tissue]
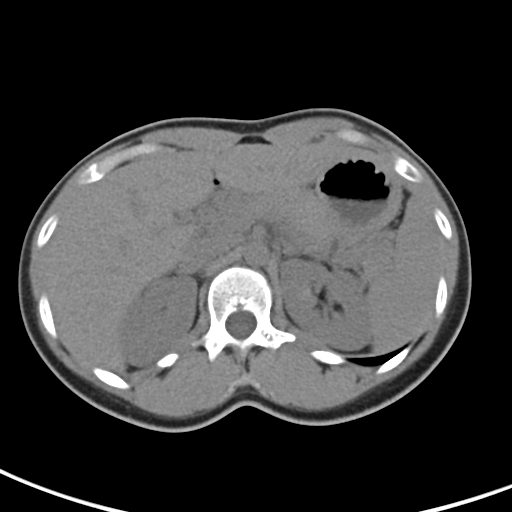
[im 69/79  soft-tissue]
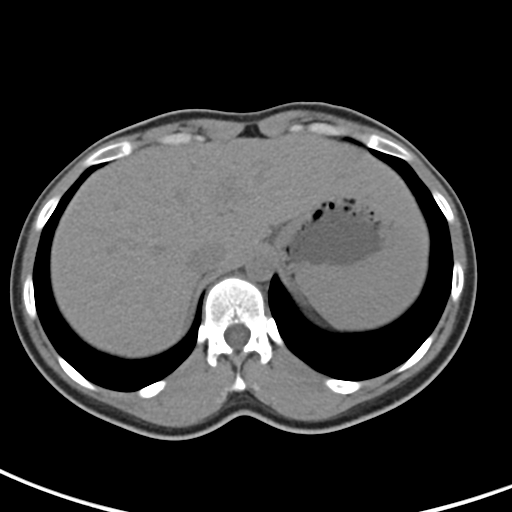
[im 75/79  soft-tissue]
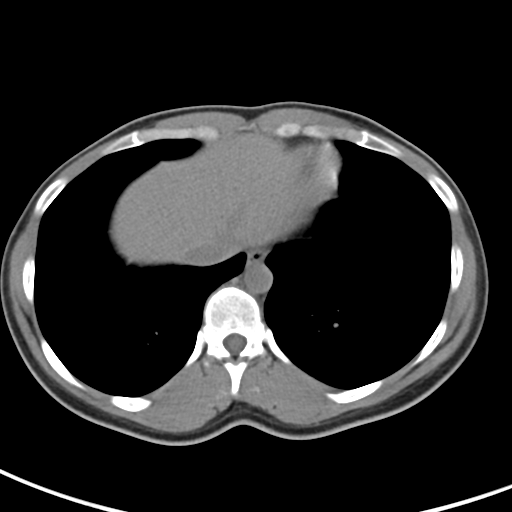

[Series 5: coronal soft tissue · coronal · 0.67mm/px · 3 of 69 slices shown]
[im 23/69  soft-tissue]
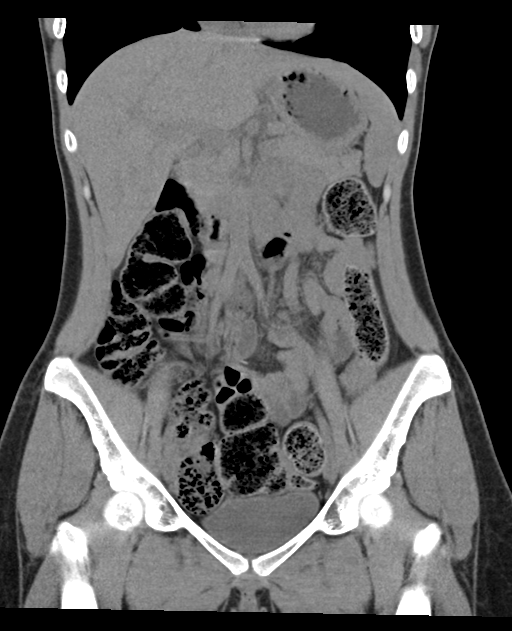
[im 31/69  soft-tissue]
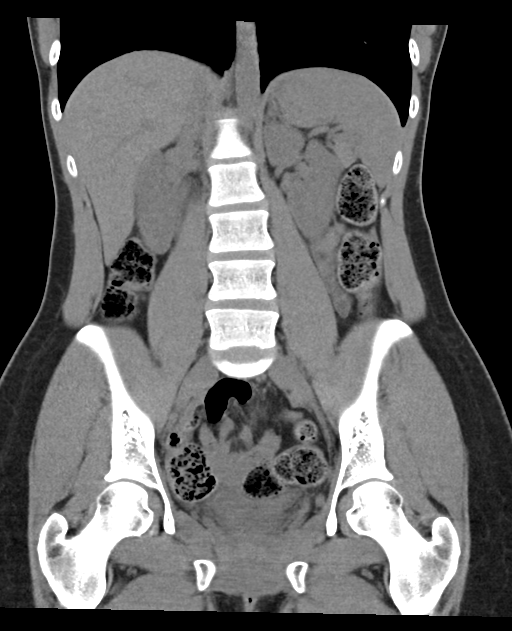
[im 38/69  soft-tissue]
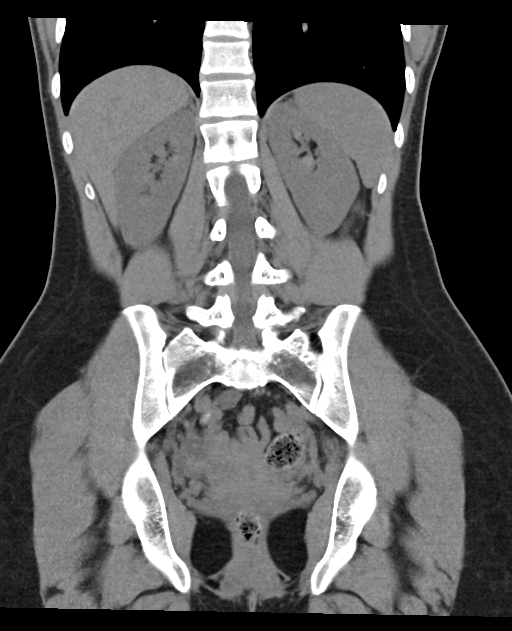

[17 of 46 positions shown; findings below may reference images not displayed]

FINDINGS: Lower chest:  No acute findings.

Hepatobiliary: No mass visualized on this un-enhanced exam.
Gallbladder is unremarkable.

Pancreas: No mass or inflammatory process identified on this
un-enhanced exam.

Spleen: Within normal limits in size.

Adrenals/Urinary Tract: No evidence of urolithiasis or
hydronephrosis. Unopacified urinary bladder is unremarkable in
appearance.

Stomach/Bowel: No evidence of obstruction, inflammatory process, or
abnormal fluid collections.

Vascular/Lymphatic: No pathologically enlarged lymph nodes. No
evidence of abdominal aortic aneurysm.

Reproductive: No mass or other significant abnormality.

Other: None.

Musculoskeletal:  No suspicious bone lesions identified.
IMPRESSION: No evidence of urolithiasis, hydronephrosis, or other acute
findings.

## 2018-08-18 ENCOUNTER — Encounter (HOSPITAL_COMMUNITY): Payer: Self-pay | Admitting: Emergency Medicine

## 2018-08-18 ENCOUNTER — Ambulatory Visit (HOSPITAL_COMMUNITY)
Admission: EM | Admit: 2018-08-18 | Discharge: 2018-08-18 | Disposition: A | Discharge status: Home / Self Care | Payer: Self-pay | Attending: Internal Medicine | Admitting: Internal Medicine

## 2018-08-18 DIAGNOSIS — N390 Urinary tract infection, site not specified: Secondary | ICD-10-CM | POA: Insufficient documentation

## 2018-08-18 LAB — POCT URINALYSIS DIP (DEVICE)
BILIRUBIN URINE: NEGATIVE
Glucose, UA: NEGATIVE mg/dL
Nitrite: POSITIVE — AB
PH: 6.5 (ref 5.0–8.0)
PROTEIN: NEGATIVE mg/dL
Specific Gravity, Urine: 1.025 (ref 1.005–1.030)
UROBILINOGEN UA: 1 mg/dL (ref 0.0–1.0)

## 2018-08-18 LAB — POCT PREGNANCY, URINE: Preg Test, Ur: NEGATIVE

## 2018-08-18 MED ORDER — FLUCONAZOLE 150 MG PO TABS: 150.0000 mg | ORAL_TABLET | Freq: Every day | ORAL | 0 refills | Status: AC

## 2018-08-18 MED ORDER — NITROFURANTOIN MONOHYD MACRO 100 MG PO CAPS: 100.0000 mg | ORAL_CAPSULE | Freq: Two times a day (BID) | ORAL | 0 refills | Status: DC

## 2018-08-18 MED ORDER — SULFAMETHOXAZOLE-TRIMETHOPRIM 800-160 MG PO TABS: 1.0000 | ORAL_TABLET | Freq: Two times a day (BID) | ORAL | 0 refills | Status: AC

## 2018-08-18 NOTE — ED Provider Notes (Signed)
[facility]    CSN: [id number] Arrival date & time: 08/18/18  1242     History   Chief Complaint Chief Complaint  Patient presents with  . Urinary Tract Infection    HPI Carolyn Harmon is a 27 y.o. female.   Who has been having intermittent dysuria x 3 week ago. Has been drinking cranberry juice and water and her urine turned from cloudy to clear. Due the cloudiness of urine returning 2-4 days ago, she decided to be seen. Last UTI 2 years. Denies any abnormal viginal discharge. Her sexual partner wears condoms. She started her period today. She is prone to get yeast infections with antibiotics.      History reviewed. No pertinent past medical history.  There are no active problems to display for this patient.   History reviewed. No pertinent surgical history.  OB History   None      Home Medications    Prior to Admission medications   Medication Sig Start Date End Date Taking? Authorizing Provider  fluconazole (DIFLUCAN) 150 MG tablet Take 1 tablet (150 mg total) by mouth daily. 08/18/18   Rodriguez-Southworth, Nettie Elm, PA-C  sulfamethoxazole-trimethoprim (BACTRIM DS,SEPTRA DS) 800-160 MG tablet Take 1 tablet by mouth 2 (two) times daily for 5 days. 08/18/18 08/23/18  Rodriguez-Southworth, Nettie Elm, PA-C    Family History History reviewed. No pertinent family history.  Social History Social History   Tobacco Use  . Smoking status: Never Smoker  . Smokeless tobacco: Never Used  Substance Use Topics  . Alcohol use: Yes    Comment: occasional glass of wine  . Drug use: No     Allergies   Patient has no known allergies.   Review of Systems Review of Systems  Constitutional: Negative for chills and diaphoresis.  Genitourinary: Positive for dysuria, frequency and urgency. Negative for hematuria and vaginal discharge.  Musculoskeletal: Negative for back pain.  Skin: Negative for rash.     Physical Exam Triage Vital Signs ED Triage Vitals    Enc Vitals Group     BP 08/18/18 1336 117/84     Pulse Rate 08/18/18 1336 82     Resp 08/18/18 1336 18     Temp 08/18/18 1336 98.1 F (36.7 C)     Temp Source 08/18/18 1336 Oral     SpO2 08/18/18 1336 100 %     Weight --      Height --      Head Circumference --      Peak Flow --      Pain Score 08/18/18 1337 3     Pain Loc --      Pain Edu? --      Excl. in GC? --    No data found.  Updated Vital Signs BP 117/84 (BP Location: Right Arm)   Pulse 82   Temp 98.1 F (36.7 C) (Oral)   Resp 18   SpO2 100%   Visual Acuity Right Eye Distance:   Left Eye Distance:   Bilateral Distance:    Right Eye Near:   Left Eye Near:    Bilateral Near:     Physical Exam   UC Treatments / Results  Labs (all labs ordered are listed, but only abnormal results are displayed) Labs Reviewed  POCT URINALYSIS DIP (DEVICE) - Abnormal; Notable for the following components:      Result Value   Ketones, ur TRACE (*)    Hgb urine dipstick MODERATE (*)    Nitrite POSITIVE (*)  Leukocytes, UA TRACE (*)    All other components within normal limits  URINE CULTURE  POCT PREGNANCY, URINE    EKG None  Radiology No results found.  Medications Ordered in UC Medications - No data to display  Initial Impression / Assessment and Plan / UC Course  I have reviewed the triage vital signs and the nursing notes.  Pertinent labs results that were available during my care of the patient were reviewed by me and considered in my medical decision making (see chart for details). I placed her on Bactrim DS as noted. Urine culture was sent out, and we will call her if we need to change her medication.    Final Clinical Impressions(s) / UC Diagnoses   Final diagnoses:  Urinary tract infection without hematuria, site unspecified     Discharge Instructions     Make sure to come back if you get worse, with fever or back pain.     ED Prescriptions    Medication Sig Dispense Auth. Provider    nitrofurantoin, macrocrystal-monohydrate, (MACROBID) 100 MG capsule  (Status: Discontinued) Take 1 capsule (100 mg total) by mouth 2 (two) times daily. 10 capsule Rodriguez-Southworth, Nettie ElmSylvia, PA-C   sulfamethoxazole-trimethoprim (BACTRIM DS,SEPTRA DS) 800-160 MG tablet Take 1 tablet by mouth 2 (two) times daily for 5 days. 10 tablet Rodriguez-Southworth, Nettie ElmSylvia, PA-C   fluconazole (DIFLUCAN) 150 MG tablet Take 1 tablet (150 mg total) by mouth daily. 2 tablet Rodriguez-Southworth, Nettie ElmSylvia, PA-C     Controlled Substance Prescriptions [place] Controlled Substance Registry consulted?    Garey HamRodriguez-Southworth, Bishop Vanderwerf, PA-C 08/18/18 1446

## 2018-08-18 NOTE — Discharge Instructions (Signed)
Make sure to come back if you get worse, with fever or back pain.

## 2018-08-18 NOTE — ED Triage Notes (Signed)
Pt here for UTI sx x 3 days.

## 2018-08-20 LAB — URINE CULTURE

## 2018-08-21 ENCOUNTER — Telehealth (HOSPITAL_COMMUNITY): Payer: Self-pay | Admitting: Emergency Medicine

## 2018-08-21 MED ORDER — NITROFURANTOIN MONOHYD MACRO 100 MG PO CAPS: 100.0000 mg | ORAL_CAPSULE | Freq: Two times a day (BID) | ORAL | 0 refills | Status: AC

## 2018-08-21 NOTE — Telephone Encounter (Signed)
Urine culture was positive for ESCHERICHIA COLI resistant to Bactrim given at urgent care visit. Prescription for macrobid per protocol sent to pharmacy of choice. Attempted to reach patient. No answer at this time. Voicemail left.

## 2018-08-23 ENCOUNTER — Telehealth (HOSPITAL_COMMUNITY): Payer: Self-pay | Admitting: Emergency Medicine

## 2018-08-23 NOTE — Telephone Encounter (Signed)
Attempted to reach patient x2. No answer at this time. Voicemail left.    

## 2018-08-24 ENCOUNTER — Telehealth (HOSPITAL_COMMUNITY): Payer: Self-pay | Admitting: Emergency Medicine

## 2018-08-24 NOTE — Telephone Encounter (Signed)
Attempted to reach patient x3. No answer at this time. Voicemail left. Letter sent    

## 2020-06-04 ENCOUNTER — Emergency Department (HOSPITAL_COMMUNITY)
Admission: EM | Admit: 2020-06-04 | Discharge: 2020-06-04 | Disposition: A | Discharge status: Home / Self Care | Payer: Self-pay | Attending: Emergency Medicine | Admitting: Emergency Medicine

## 2020-06-04 ENCOUNTER — Encounter (HOSPITAL_COMMUNITY): Payer: Self-pay | Admitting: Emergency Medicine

## 2020-06-04 ENCOUNTER — Other Ambulatory Visit: Payer: Self-pay

## 2020-06-04 DIAGNOSIS — L989 Disorder of the skin and subcutaneous tissue, unspecified: Secondary | ICD-10-CM | POA: Insufficient documentation

## 2020-06-04 NOTE — Discharge Instructions (Addendum)
Keep area clean. Follow-up next week if not improving

## 2020-06-04 NOTE — ED Provider Notes (Signed)
[facility]-EMERGENCY DEPT Provider Note   CSN: [id number] Arrival date & time: 06/04/20  1911     History Chief Complaint  Patient presents with  . Lip lesion  . Possible Allergic Rx    Carolyn Harmon is a 29 y.o. female.  Patient stated that she developed a lesion on her lip today.  It is in the lower lip and it does not hurt  The history is provided by the patient and medical records. No language interpreter was used.  Rash Location: Right side of her lower lip. Severity:  Mild Onset quality:  Sudden Timing:  Constant Progression:  Unable to specify Chronicity:  New Context: not animal contact   Associated symptoms: no abdominal pain, no diarrhea, no fatigue and no headaches        History reviewed. No pertinent past medical history.  There are no problems to display for this patient.   History reviewed. No pertinent surgical history.   OB History   No obstetric history on file.     No family history on file.  Social History   Tobacco Use  . Smoking status: Never Smoker  . Smokeless tobacco: Never Used  Substance Use Topics  . Alcohol use: Yes    Comment: occasional glass of wine  . Drug use: No    Home Medications Prior to Admission medications   Medication Sig Start Date End Date Taking? Authorizing Provider  fluconazole (DIFLUCAN) 150 MG tablet Take 1 tablet (150 mg total) by mouth daily. 08/18/18   Rodriguez-Southworth, Nettie Elm, PA-C  nitrofurantoin, macrocrystal-monohydrate, (MACROBID) 100 MG capsule Take 1 capsule (100 mg total) by mouth 2 (two) times daily. 08/21/18   Eustace Moore, MD    Allergies    Patient has no known allergies.  Review of Systems   Review of Systems  Constitutional: Negative for appetite change and fatigue.  HENT: Negative for congestion, ear discharge and sinus pressure.   Eyes: Negative for discharge.  Respiratory: Negative for cough.   Cardiovascular: Negative for chest pain.    Gastrointestinal: Negative for abdominal pain and diarrhea.  Genitourinary: Negative for frequency and hematuria.  Musculoskeletal: Negative for back pain.  Skin: Positive for rash.       Small swelling to right side of lower lip  Neurological: Negative for seizures and headaches.  Psychiatric/Behavioral: Negative for hallucinations.    Physical Exam Updated Vital Signs BP 126/77   Pulse 84   Temp 98.2 F (36.8 C) (Oral)   Resp 19   Ht 5\' 4"  (1.626 m)   Wt 53.1 kg   SpO2 100%   BMI 20.08 kg/m   Physical Exam Vitals and nursing note reviewed.  Constitutional:      Appearance: She is well-developed.  HENT:     Head: Normocephalic.     Mouth/Throat:     Comments: One half a centimeter swelling to right side lower lip nontender Eyes:     Conjunctiva/sclera: Conjunctivae normal.  Neck:     Trachea: No tracheal deviation.  Cardiovascular:     Heart sounds: No murmur heard.   Musculoskeletal:        General: Normal range of motion.  Skin:    General: Skin is warm.  Neurological:     Mental Status: She is alert and oriented to person, place, and time.     ED Results / Procedures / Treatments   Labs (all labs ordered are listed, but only abnormal results are displayed) Labs Reviewed - No data  to display  EKG None  Radiology No results found.  Procedures Procedures (including critical care time)  Medications Ordered in ED Medications - No data to display  ED Course  I have reviewed the triage vital signs and the nursing notes.  Pertinent labs & imaging results that were available during my care of the patient were reviewed by me and considered in my medical decision making (see chart for details).    MDM Rules/Calculators/A&P                          Patient with minor skin lesion on her lower lip.  This could be an early ulcer.  I told the patient just keep an eye follow-up next week if needed Final Clinical Impression(s) / ED Diagnoses Final  diagnoses:  Skin abnormality    Rx / DC Orders ED Discharge Orders    None       Bethann Berkshire, MD 06/05/20 1003

## 2020-06-04 NOTE — ED Triage Notes (Signed)
Patient presents after drinking after her friend and states that she had a bump on her bottom lip pop up after her lip began to tingle. No swelling noted to tongue or lip. Small spot noted on bottom right side of her lip. Patient states it caused her to have a panic attack because she was worried.

## 2022-02-06 ENCOUNTER — Ambulatory Visit: Payer: Self-pay
